# Patient Record
Sex: Female | Born: 1970 | Race: White | Hispanic: No | Marital: Married | Smoking: Never smoker
Health system: Southern US, Community
[De-identification: ages and names within clinical notes are randomized; demographics above are authoritative.]

## PROBLEM LIST (undated history)

## (undated) DIAGNOSIS — K219 Gastro-esophageal reflux disease without esophagitis: Secondary | ICD-10-CM

## (undated) DIAGNOSIS — I10 Essential (primary) hypertension: Secondary | ICD-10-CM

## (undated) DIAGNOSIS — R7401 Elevation of levels of liver transaminase levels: Secondary | ICD-10-CM

## (undated) DIAGNOSIS — E119 Type 2 diabetes mellitus without complications: Secondary | ICD-10-CM

## (undated) DIAGNOSIS — R740 Nonspecific elevation of levels of transaminase and lactic acid dehydrogenase [LDH]: Secondary | ICD-10-CM

## (undated) DIAGNOSIS — E039 Hypothyroidism, unspecified: Secondary | ICD-10-CM

## (undated) DIAGNOSIS — G43909 Migraine, unspecified, not intractable, without status migrainosus: Secondary | ICD-10-CM

## (undated) DIAGNOSIS — J45909 Unspecified asthma, uncomplicated: Secondary | ICD-10-CM

## (undated) DIAGNOSIS — M797 Fibromyalgia: Secondary | ICD-10-CM

## (undated) DIAGNOSIS — E7800 Pure hypercholesterolemia, unspecified: Secondary | ICD-10-CM

## (undated) DIAGNOSIS — F329 Major depressive disorder, single episode, unspecified: Secondary | ICD-10-CM

## (undated) DIAGNOSIS — F32A Depression, unspecified: Secondary | ICD-10-CM

## (undated) DIAGNOSIS — E282 Polycystic ovarian syndrome: Secondary | ICD-10-CM

## (undated) HISTORY — DX: Elevation of levels of liver transaminase levels: R74.01

## (undated) HISTORY — DX: Unspecified asthma, uncomplicated: J45.909

## (undated) HISTORY — DX: Essential (primary) hypertension: I10

## (undated) HISTORY — PX: CHOLECYSTECTOMY: SHX55

## (undated) HISTORY — DX: Hypothyroidism, unspecified: E03.9

## (undated) HISTORY — PX: OTHER SURGICAL HISTORY: SHX169

## (undated) HISTORY — DX: Nonspecific elevation of levels of transaminase and lactic acid dehydrogenase (ldh): R74.0

## (undated) HISTORY — DX: Gastro-esophageal reflux disease without esophagitis: K21.9

---

## 1997-12-11 ENCOUNTER — Encounter: Admission: RE | Admit: 1997-12-11 | Discharge: 1997-12-11 | Payer: Self-pay | Admitting: Internal Medicine

## 1997-12-29 ENCOUNTER — Encounter: Admission: RE | Admit: 1997-12-29 | Discharge: 1997-12-29 | Payer: Self-pay | Admitting: Hematology and Oncology

## 1998-02-18 ENCOUNTER — Encounter: Admission: RE | Admit: 1998-02-18 | Discharge: 1998-02-18 | Payer: Self-pay | Admitting: Internal Medicine

## 1998-02-18 ENCOUNTER — Other Ambulatory Visit: Admission: RE | Admit: 1998-02-18 | Discharge: 1998-02-18 | Payer: Self-pay | Admitting: Obstetrics

## 1998-03-03 ENCOUNTER — Encounter: Admission: RE | Admit: 1998-03-03 | Discharge: 1998-03-03 | Payer: Self-pay | Admitting: Internal Medicine

## 1998-04-09 ENCOUNTER — Encounter: Admission: RE | Admit: 1998-04-09 | Discharge: 1998-04-09 | Payer: Self-pay | Admitting: Internal Medicine

## 1998-05-19 ENCOUNTER — Encounter: Admission: RE | Admit: 1998-05-19 | Discharge: 1998-05-19 | Payer: Self-pay | Admitting: Internal Medicine

## 1998-06-23 ENCOUNTER — Encounter: Admission: RE | Admit: 1998-06-23 | Discharge: 1998-06-23 | Payer: Self-pay | Admitting: Internal Medicine

## 1998-06-25 ENCOUNTER — Ambulatory Visit (HOSPITAL_COMMUNITY): Admission: RE | Admit: 1998-06-25 | Discharge: 1998-06-25 | Payer: Self-pay | Admitting: Internal Medicine

## 1998-06-25 ENCOUNTER — Encounter: Payer: Self-pay | Admitting: Internal Medicine

## 1998-08-17 ENCOUNTER — Encounter: Admission: RE | Admit: 1998-08-17 | Discharge: 1998-08-17 | Payer: Self-pay | Admitting: Obstetrics & Gynecology

## 1998-10-25 ENCOUNTER — Encounter: Admission: RE | Admit: 1998-10-25 | Discharge: 1998-10-25 | Payer: Self-pay | Admitting: Internal Medicine

## 1998-11-17 ENCOUNTER — Encounter: Admission: RE | Admit: 1998-11-17 | Discharge: 1998-11-17 | Payer: Self-pay | Admitting: Internal Medicine

## 1998-11-17 ENCOUNTER — Encounter: Payer: Self-pay | Admitting: Internal Medicine

## 1998-11-17 ENCOUNTER — Ambulatory Visit (HOSPITAL_COMMUNITY): Admission: RE | Admit: 1998-11-17 | Discharge: 1998-11-17 | Payer: Self-pay | Admitting: Internal Medicine

## 1998-11-29 ENCOUNTER — Encounter: Admission: RE | Admit: 1998-11-29 | Discharge: 1998-11-29 | Payer: Self-pay | Admitting: Internal Medicine

## 1998-12-28 ENCOUNTER — Encounter: Admission: RE | Admit: 1998-12-28 | Discharge: 1998-12-28 | Payer: Self-pay | Admitting: Internal Medicine

## 1999-04-15 ENCOUNTER — Ambulatory Visit (HOSPITAL_COMMUNITY): Admission: RE | Admit: 1999-04-15 | Discharge: 1999-04-15 | Payer: Self-pay | Admitting: Urology

## 1999-04-15 ENCOUNTER — Encounter: Payer: Self-pay | Admitting: Urology

## 1999-09-19 ENCOUNTER — Other Ambulatory Visit: Admission: RE | Admit: 1999-09-19 | Discharge: 1999-09-19 | Payer: Self-pay | Admitting: *Deleted

## 2010-07-10 ENCOUNTER — Emergency Department (HOSPITAL_COMMUNITY): Admission: EM | Admit: 2010-07-10 | Discharge: 2010-07-11 | Payer: Self-pay | Admitting: Emergency Medicine

## 2010-07-11 IMAGING — CR DG ABDOMEN ACUTE W/ 1V CHEST
4 series · 4 of 4 positions shown · non-contrast
Comparison: None.

CLINICAL DATA: Abdominal pain, nausea, vomiting and diarrhea.

ACUTE ABDOMEN SERIES (ABDOMEN 2 VIEW & CHEST 1 VIEW)

[view not recorded (1 of 4)]
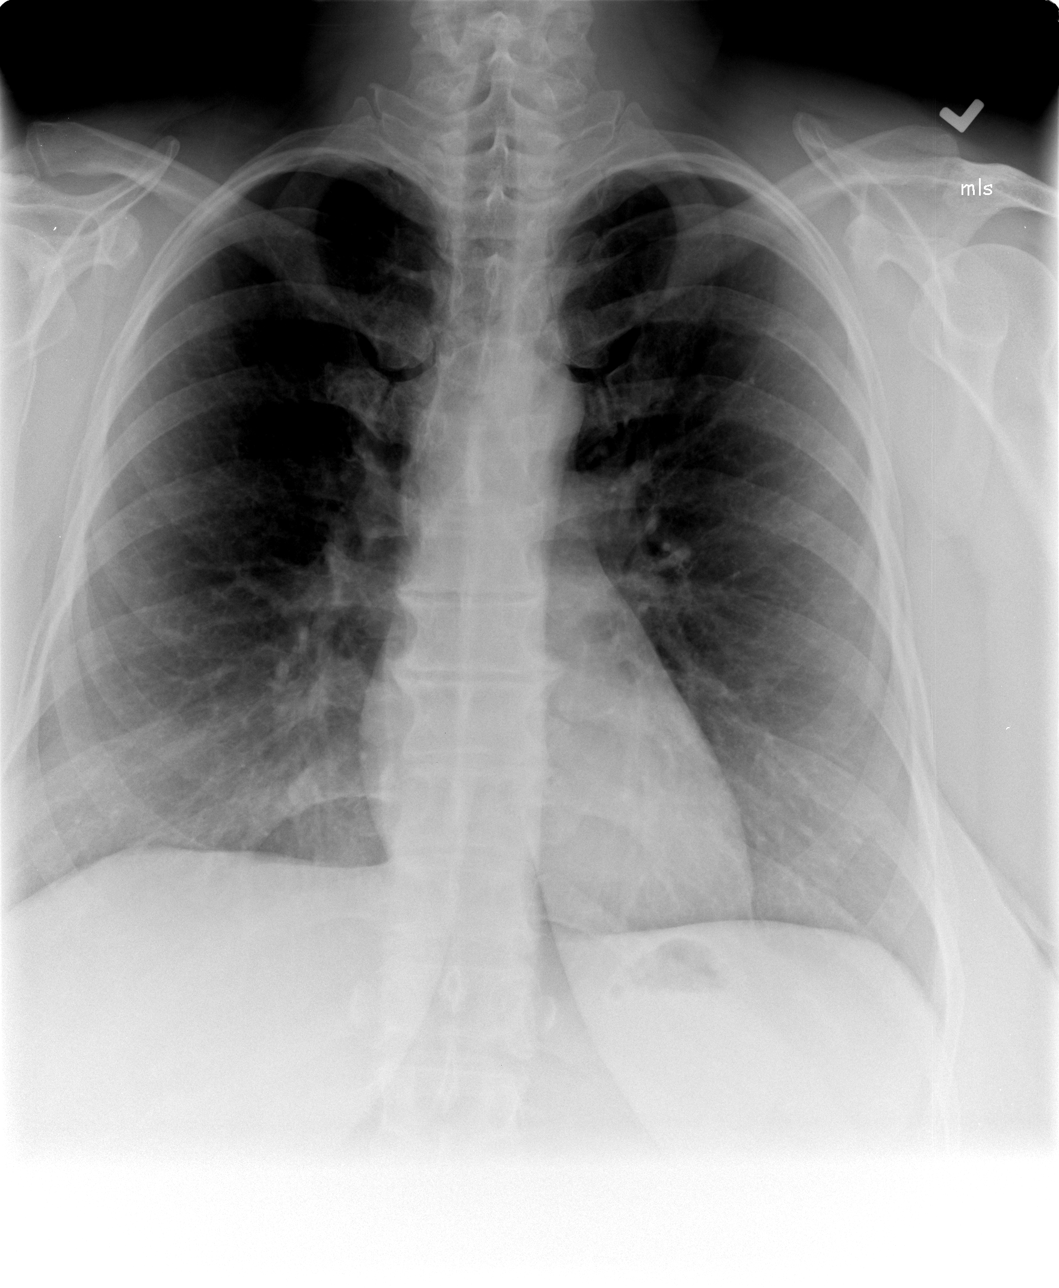

[view not recorded (2 of 4)]
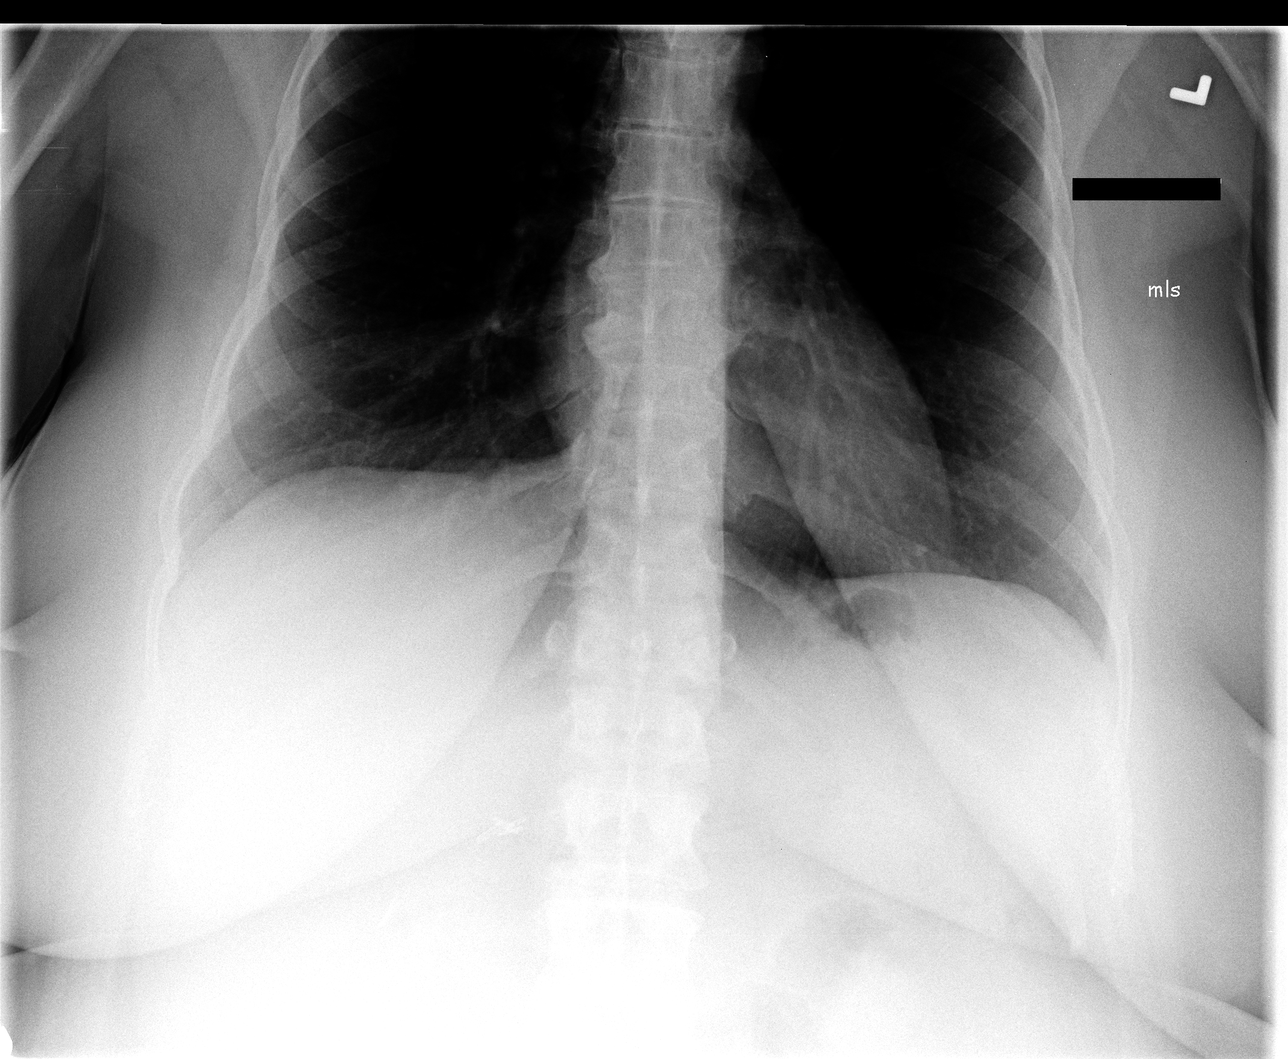

[view not recorded (3 of 4)]
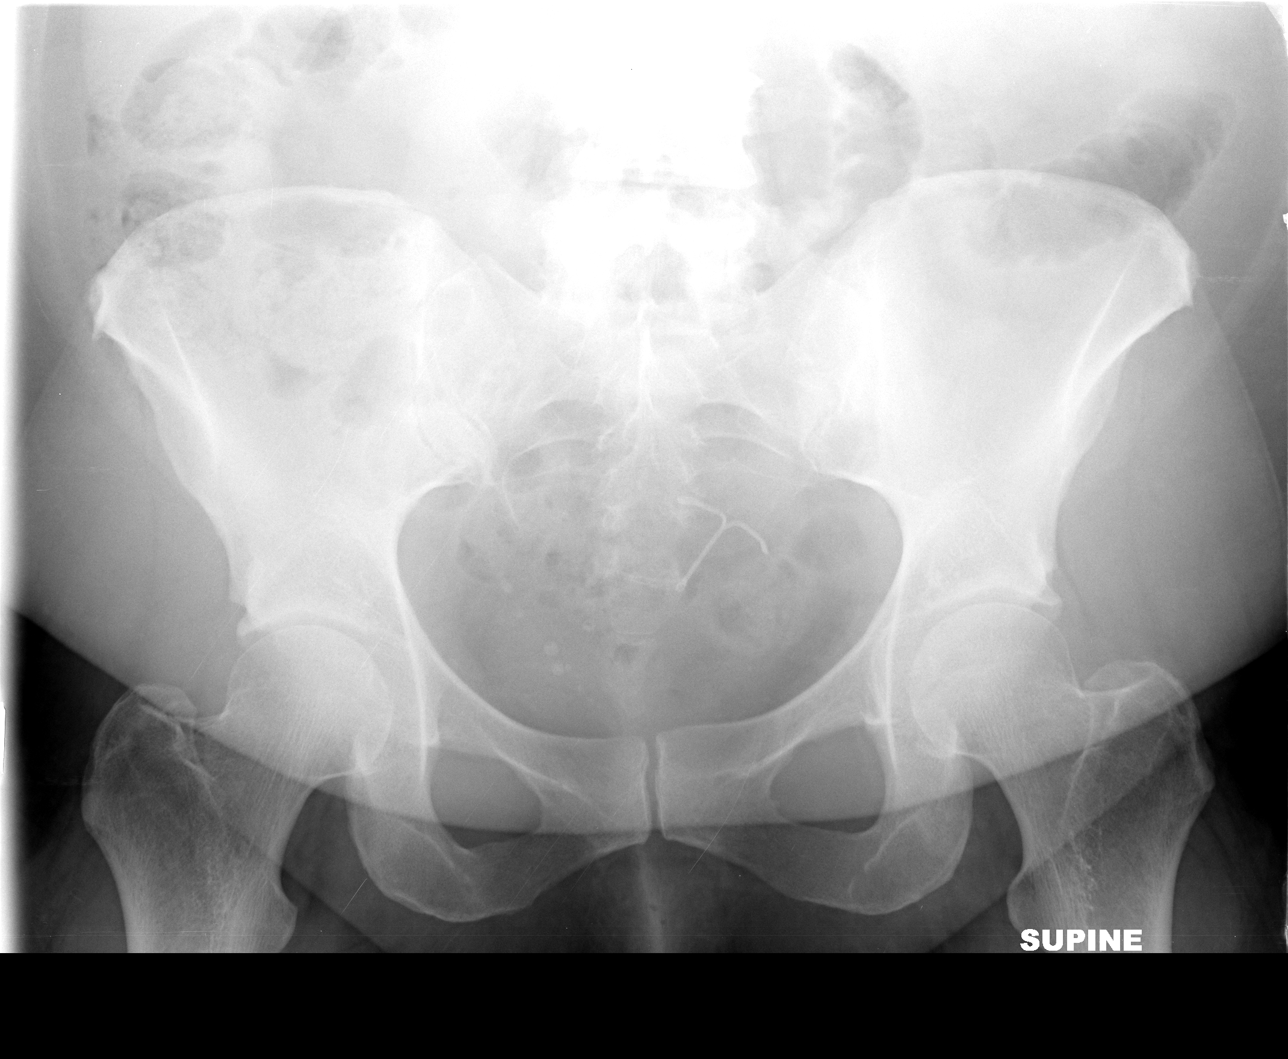

[view not recorded (4 of 4)]
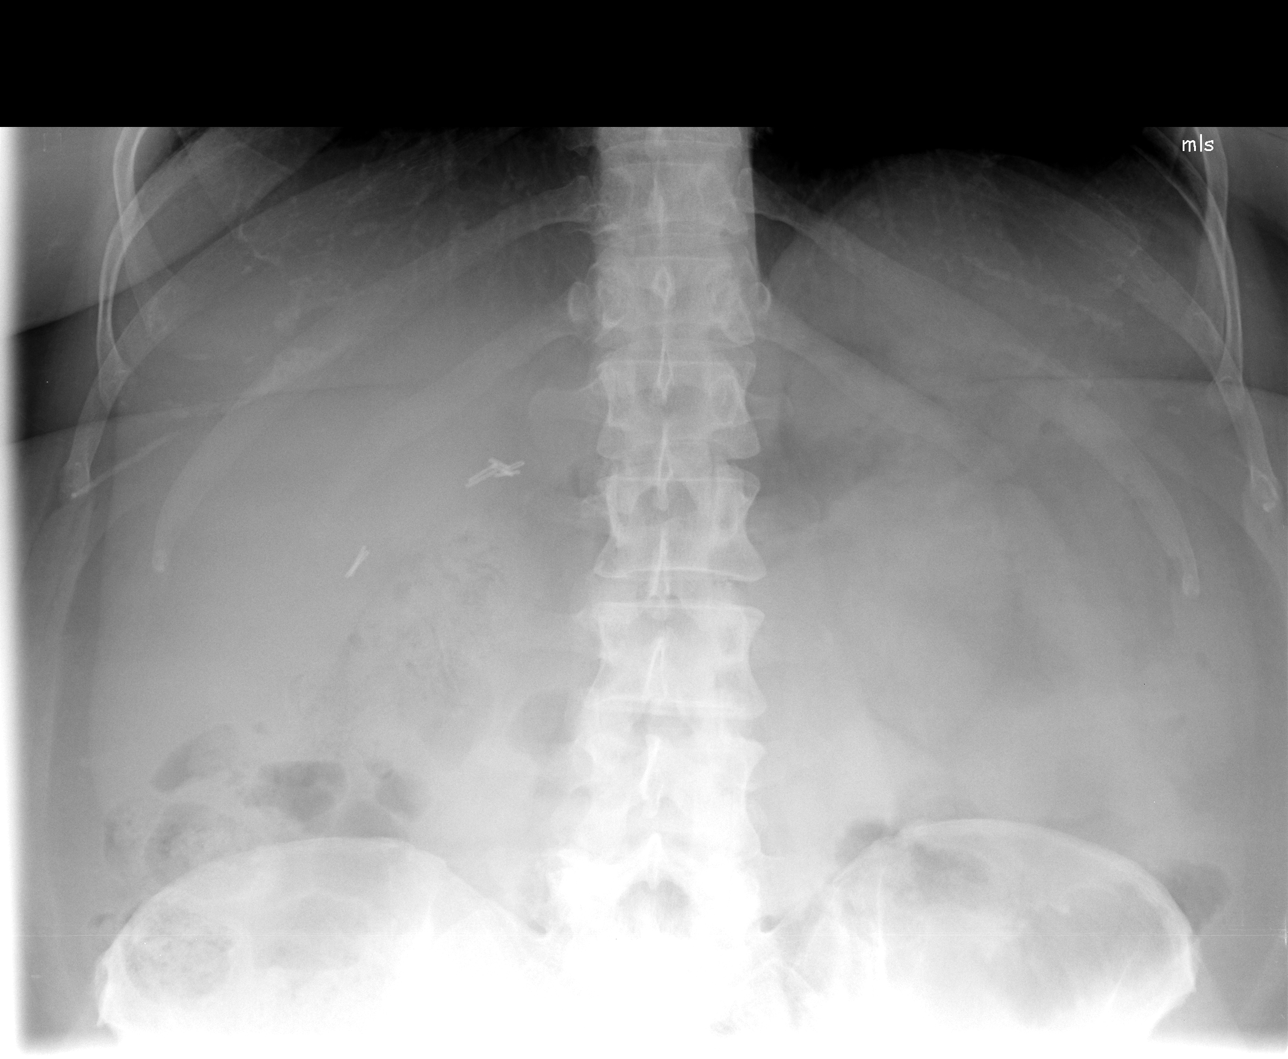

[4 of 4 positions shown; findings below may reference images not displayed]

FINDINGS: The lungs are well-aerated and clear.  There is no
evidence of focal opacification, pleural effusion or pneumothorax.
The cardiomediastinal silhouette is within normal limits.

The visualized bowel gas pattern is unremarkable. Stool and air are
noted throughout the colon; there is no evidence of small bowel
dilatation to suggest obstruction.  No free intra-abdominal air is
identified on the provided upright view.  An intrauterine device is
imaged within the pelvis.  Clips are noted within the right upper
quadrant, reflecting prior cholecystectomy.

No acute osseous abnormalities are seen; the sacroiliac joints are
unremarkable in appearance.
IMPRESSION: 1.  Unremarkable bowel gas pattern; no free intra-abdominal air
seen.
2.  No acute cardiopulmonary process identified.

## 2010-09-11 ENCOUNTER — Encounter: Payer: Self-pay | Admitting: Internal Medicine

## 2010-11-01 LAB — CBC
Hemoglobin: 16 g/dL — ABNORMAL HIGH (ref 12.0–15.0)
MCH: 30.6 pg (ref 26.0–34.0)
MCHC: 33.6 g/dL (ref 30.0–36.0)
MCV: 91.2 fL (ref 78.0–100.0)
Platelets: 237 10*3/uL (ref 150–400)

## 2010-11-01 LAB — URINALYSIS, ROUTINE W REFLEX MICROSCOPIC
Glucose, UA: NEGATIVE mg/dL
Ketones, ur: >80 mg/dL — AB
Leukocytes, UA: NEGATIVE
Nitrite: NEGATIVE
Protein, ur: 30 mg/dL — AB
Specific Gravity, Urine: 1.025 (ref 1.005–1.030)
Urobilinogen, UA: 0.2 mg/dL (ref 0.0–1.0)
pH: 6 (ref 5.0–8.0)

## 2010-11-01 LAB — URINE MICROSCOPIC-ADD ON

## 2010-11-01 LAB — DIFFERENTIAL
Basophils Relative: 1 % (ref 0–1)
Eosinophils Absolute: 0 10*3/uL (ref 0.0–0.7)
Eosinophils Relative: 0 % (ref 0–5)
Lymphs Abs: 1.6 10*3/uL (ref 0.7–4.0)
Monocytes Relative: 4 % (ref 3–12)
Neutrophils Relative %: 86 % — ABNORMAL HIGH (ref 43–77)

## 2010-11-01 LAB — BASIC METABOLIC PANEL
CO2: 24 mEq/L (ref 19–32)
Calcium: 10.2 mg/dL (ref 8.4–10.5)
Chloride: 92 mEq/L — ABNORMAL LOW (ref 96–112)
Creatinine, Ser: 1.22 mg/dL — ABNORMAL HIGH (ref 0.4–1.2)
Glucose, Bld: 121 mg/dL — ABNORMAL HIGH (ref 70–99)

## 2014-11-14 ENCOUNTER — Emergency Department (HOSPITAL_BASED_OUTPATIENT_CLINIC_OR_DEPARTMENT_OTHER)
Admission: EM | Admit: 2014-11-14 | Discharge: 2014-11-14 | Disposition: A | Discharge status: Home / Self Care | Payer: Medicaid Other | Attending: Emergency Medicine | Admitting: Emergency Medicine

## 2014-11-14 ENCOUNTER — Encounter (HOSPITAL_BASED_OUTPATIENT_CLINIC_OR_DEPARTMENT_OTHER): Payer: Self-pay

## 2014-11-14 DIAGNOSIS — E780 Pure hypercholesterolemia: Secondary | ICD-10-CM | POA: Diagnosis not present

## 2014-11-14 DIAGNOSIS — Z3202 Encounter for pregnancy test, result negative: Secondary | ICD-10-CM | POA: Insufficient documentation

## 2014-11-14 DIAGNOSIS — R350 Frequency of micturition: Secondary | ICD-10-CM | POA: Diagnosis present

## 2014-11-14 DIAGNOSIS — E669 Obesity, unspecified: Secondary | ICD-10-CM | POA: Diagnosis not present

## 2014-11-14 DIAGNOSIS — Z79899 Other long term (current) drug therapy: Secondary | ICD-10-CM | POA: Diagnosis not present

## 2014-11-14 DIAGNOSIS — N390 Urinary tract infection, site not specified: Secondary | ICD-10-CM | POA: Diagnosis not present

## 2014-11-14 DIAGNOSIS — E119 Type 2 diabetes mellitus without complications: Secondary | ICD-10-CM | POA: Insufficient documentation

## 2014-11-14 DIAGNOSIS — M797 Fibromyalgia: Secondary | ICD-10-CM | POA: Insufficient documentation

## 2014-11-14 DIAGNOSIS — G43909 Migraine, unspecified, not intractable, without status migrainosus: Secondary | ICD-10-CM | POA: Diagnosis not present

## 2014-11-14 DIAGNOSIS — F329 Major depressive disorder, single episode, unspecified: Secondary | ICD-10-CM | POA: Insufficient documentation

## 2014-11-14 HISTORY — DX: Depression, unspecified: F32.A

## 2014-11-14 HISTORY — DX: Major depressive disorder, single episode, unspecified: F32.9

## 2014-11-14 HISTORY — DX: Type 2 diabetes mellitus without complications: E11.9

## 2014-11-14 HISTORY — DX: Pure hypercholesterolemia, unspecified: E78.00

## 2014-11-14 HISTORY — DX: Migraine, unspecified, not intractable, without status migrainosus: G43.909

## 2014-11-14 HISTORY — DX: Polycystic ovarian syndrome: E28.2

## 2014-11-14 HISTORY — DX: Fibromyalgia: M79.7

## 2014-11-14 LAB — URINALYSIS, ROUTINE W REFLEX MICROSCOPIC
BILIRUBIN URINE: NEGATIVE
Ketones, ur: NEGATIVE mg/dL
Nitrite: NEGATIVE
PH: 6 (ref 5.0–8.0)
PROTEIN: NEGATIVE mg/dL
Specific Gravity, Urine: 1.021 (ref 1.005–1.030)
UROBILINOGEN UA: 0.2 mg/dL (ref 0.0–1.0)

## 2014-11-14 LAB — CBG MONITORING, ED: GLUCOSE-CAPILLARY: 98 mg/dL (ref 70–99)

## 2014-11-14 LAB — URINE MICROSCOPIC-ADD ON

## 2014-11-14 LAB — PREGNANCY, URINE: PREG TEST UR: NEGATIVE

## 2014-11-14 MED ORDER — PHENAZOPYRIDINE HCL 200 MG PO TABS: 200.0000 mg | ORAL_TABLET | Freq: Three times a day (TID) | ORAL | Status: AC

## 2014-11-14 MED ORDER — PHENAZOPYRIDINE HCL 100 MG PO TABS
200.0000 mg | ORAL_TABLET | Freq: Once | ORAL | Status: AC
  Administered 2014-11-14: 200 mg via ORAL
  Filled 2014-11-14: qty 2

## 2014-11-14 MED ORDER — CEPHALEXIN 250 MG PO CAPS
500.0000 mg | ORAL_CAPSULE | Freq: Once | ORAL | Status: AC
  Administered 2014-11-14: 500 mg via ORAL
  Filled 2014-11-14: qty 2

## 2014-11-14 MED ORDER — CEPHALEXIN 500 MG PO CAPS: 500.0000 mg | ORAL_CAPSULE | Freq: Two times a day (BID) | ORAL | Status: AC

## 2014-11-14 NOTE — Discharge Instructions (Signed)
°  Take your antibiotics as directed and to completion. You should never have any leftover antibiotics! Push fluids and stay well hydrated.  ° °Any antibiotic use can reduce the efficacy of hormonal birth control. Please use back up method of contraception.  ° °Please follow with your primary care doctor in the next 2 days for a check-up. They must obtain records for further management.  ° °Do not hesitate to return to the Emergency Department for any new, worsening or concerning symptoms.  ° °

## 2014-11-14 NOTE — ED Provider Notes (Signed)
CSN: [id number]     Arrival date & time 11/14/14  1522 History   First MD Initiated Contact with Patient 11/14/14 1555     Chief Complaint  Patient presents with  . Urinary Frequency     (Consider location/radiation/quality/duration/timing/severity/associated sxs/prior Treatment) HPI  Karen Barrett is a 44 y.o. female complaining of urinary frequency, she denies dysuria but states she has a "strange sensation" when she urinates, she cannot further characterize this, sensation of incomplete voiding and concentrated urine worsening over the course of one week. She denies fever, chills, nausea, vomiting, flank pain, abdominal pain, abnormal vaginal discharge   Past Medical History  Diagnosis Date  . Diabetes mellitus without complication   . PCOS (polycystic ovarian syndrome)   . Fibromyalgia   . Depression   . Hypercholesteremia   . Migraine    Past Surgical History  Procedure Laterality Date  . Cholecystectomy    . Toenail surgery     No family history on file. History  Substance Use Topics  . Smoking status: Never Smoker   . Smokeless tobacco: Not on file  . Alcohol Use: No   OB History    No data available     Review of Systems  10 systems reviewed and found to be negative, except as noted in the HPI.   Allergies  Codeine; Erythromycin; Paxil; Zoloft; and Sulfa antibiotics  Home Medications   Prior to Admission medications   Medication Sig Start Date End Date Taking? Authorizing Provider  buPROPion (WELLBUTRIN) 100 MG tablet Take 100 mg by mouth 2 (two) times daily.   Yes Historical Provider, MD  Canagliflozin (INVOKANA PO) Take by mouth.   Yes Historical Provider, MD  cyclobenzaprine (FLEXERIL) 10 MG tablet Take 10 mg by mouth 3 (three) times daily as needed for muscle spasms.   Yes Historical Provider, MD  rosuvastatin (CRESTOR) 10 MG tablet Take 10 mg by mouth daily.   Yes Historical Provider, MD  SUMAtriptan (IMITREX) 20 MG/ACT nasal spray Place 20  mg into the nose every 2 (two) hours as needed for migraine or headache. May repeat in 2 hours if headache persists or recurs.   Yes Historical Provider, MD  topiramate (TOPAMAX) 100 MG tablet Take 100 mg by mouth 2 (two) times daily.   Yes Historical Provider, MD  traMADol (ULTRAM) 50 MG tablet Take by mouth every 6 (six) hours as needed.   Yes Historical Provider, MD  cephALEXin (KEFLEX) 500 MG capsule Take 1 capsule (500 mg total) by mouth 2 (two) times daily. 11/14/14   Kaily Wragg, PA-C  phenazopyridine (PYRIDIUM) 200 MG tablet Take 1 tablet (200 mg total) by mouth 3 (three) times daily. 11/14/14   Halona Amstutz, PA-C   BP 153/93 mmHg  Pulse 93  Temp(Src) 98 F (36.7 C) (Oral)  Resp 16  Ht  (1.651 m)  Wt 290 lb (131.543 kg)  BMI 48.26 kg/m2  SpO2 100% Physical Exam  Constitutional: She is oriented to person, place, and time. She appears well-developed and well-nourished. No distress.  Obese  HENT:  Head: Normocephalic and atraumatic.  Mouth/Throat: Oropharynx is clear and moist.  Eyes: Conjunctivae and EOM are normal. Pupils are equal, round, and reactive to light.  Neck: Normal range of motion.  Cardiovascular: Normal rate, regular rhythm and intact distal pulses.   Pulmonary/Chest: Effort normal and breath sounds normal. No stridor. No respiratory distress. She has no wheezes. She has no rales. She exhibits no tenderness.  Abdominal: Soft. She exhibits no distension  and no mass. There is no tenderness. There is no rebound and no guarding.  Genitourinary:  No CVA tenderness to palpation bilaterally  Musculoskeletal: Normal range of motion.  Neurological: She is alert and oriented to person, place, and time.  Psychiatric: She has a normal mood and affect.  Nursing note and vitals reviewed.   ED Course  Procedures (including critical care time) Labs Review Labs Reviewed  URINALYSIS, ROUTINE W REFLEX MICROSCOPIC - Abnormal; Notable for the following:    APPearance  CLOUDY (*)    Glucose, UA >1000 (*)    Hgb urine dipstick LARGE (*)    Leukocytes, UA LARGE (*)    All other components within normal limits  URINE MICROSCOPIC-ADD ON - Abnormal; Notable for the following:    Bacteria, UA MANY (*)    All other components within normal limits  URINE CULTURE  PREGNANCY, URINE  CBG MONITORING, ED    Imaging Review No results found.   EKG Interpretation None      MDM   Final diagnoses:  UTI (lower urinary tract infection)    Filed Vitals:   11/14/14 1527 11/14/14 1635  BP: 147/90 153/93  Pulse: 89 93  Temp: 98 F (36.7 C)   TempSrc: Oral   Resp: 18 16  Height: 5\' 5"  (1.651 m)   Weight: 290 lb (131.543 kg)   SpO2: 100% 100%    Medications  cephALEXin (KEFLEX) capsule 500 mg (500 mg Oral Given 11/14/14 1641)  phenazopyridine (PYRIDIUM) tablet 200 mg (200 mg Oral Given 11/14/14 1641)    [name] is a pleasant 44 y.o. female presenting with uncomplicated urinary tract infection, urine culture is ordered. Patient is afebrile, well-appearing and tolerating by mouth. I will start her on Keflex.   Evaluation does not show pathology that would require ongoing emergent intervention or inpatient treatment. Pt is hemodynamically stable and mentating appropriately. Discussed findings and plan with patient/guardian, who agrees with care plan. All questions answered. Return precautions discussed and outpatient follow up given.   Discharge Medication List as of 11/14/2014  4:36 PM    START taking these medications   Details  cephALEXin (KEFLEX) 500 MG capsule Take 1 capsule (500 mg total) by mouth 2 (two) times daily., Starting 11/14/2014, Until Discontinued, Print    phenazopyridine (PYRIDIUM) 200 MG tablet Take 1 tablet (200 mg total) by mouth 3 (three) times daily., Starting 11/14/2014, Until Discontinued, Print             Wynetta Emeryicole Niamya Vittitow, PA-C 11/14/14 1737  Tilden FossaElizabeth Rees, MD 11/14/14 [phone]

## 2014-11-14 NOTE — ED Notes (Signed)
Pt reports urinary frequency for about 1 week, denies dysuria.  Reports "it feels weird when i get done peeing".  Denies additional symptoms.

## 2014-11-17 LAB — URINE CULTURE: Colony Count: 30000

## 2014-11-18 ENCOUNTER — Telehealth (HOSPITAL_BASED_OUTPATIENT_CLINIC_OR_DEPARTMENT_OTHER): Payer: Self-pay | Admitting: Emergency Medicine

## 2014-11-18 NOTE — Telephone Encounter (Signed)
Post ED Visit - Positive Culture Follow-up  Culture report reviewed by antimicrobial stewardship pharmacist: []  Wes Dulaney, Pharm.D., BCPS []  Celedonio MiyamotoJeremy Frens, [address] BCPS []  Georgina PillionElizabeth Martin, [address] BCPS []  PrincetonMinh Pham, [address] BCPS, AAHIVP [x]  Estella HuskMichelle Turner, Pharm.D., BCPS, AAHIVP []  Elder CyphersLorie Poole, [address] BCPS  Positive urine culture E. Coli Treated with cephalexin , organism sensitive to the same and no further patient follow-up is required at this time.  Berle MullMiller, Cierra Rothgeb 11/18/2014, 12:32 PM

## 2015-09-14 ENCOUNTER — Ambulatory Visit: Payer: Self-pay | Admitting: Pediatrics

## 2015-10-04 ENCOUNTER — Ambulatory Visit (INDEPENDENT_AMBULATORY_CARE_PROVIDER_SITE_OTHER): Payer: Medicaid Other | Admitting: Internal Medicine

## 2015-10-04 ENCOUNTER — Encounter: Payer: Self-pay | Admitting: Internal Medicine

## 2015-10-04 VITALS — BP 124/80 | HR 98 | Temp 98.8°F | Resp 20 | Ht 65.55 in | Wt 307.8 lb

## 2015-10-04 DIAGNOSIS — J3089 Other allergic rhinitis: Secondary | ICD-10-CM | POA: Diagnosis not present

## 2015-10-04 DIAGNOSIS — J45909 Unspecified asthma, uncomplicated: Secondary | ICD-10-CM | POA: Insufficient documentation

## 2015-10-04 DIAGNOSIS — J4520 Mild intermittent asthma, uncomplicated: Secondary | ICD-10-CM | POA: Diagnosis not present

## 2015-10-04 MED ORDER — MOMETASONE FUROATE 50 MCG/ACT NA SUSP: 2.0000 | Freq: Every day | NASAL | Status: DC

## 2015-10-04 NOTE — Progress Notes (Signed)
Referring provider: Jackie Plum, MD [address] [place], [place] [address]  History of Present Illness:  Karen Barrett is a 45 y.o. female  seen in consultation at the kind request Dr. Leanor Kail for rhinitis and asthma  HPI Comments: Rhinitis/migraines: Patient has had symptoms for many years. She has symptoms a few times a week and she feels that is worse as she has gotten older. She has tried [employer] but does not like using it due to burning of her nostrils. She has never had skin testing before. She does not get repeated infections requiring antibiotics. She saw an ENT for enlarged tonsils and surgery was recommended reportedly but she is deferring  Asthma: Patient was diagnosed several years ago. She has never been to the emergency room or hospitalized. She gets bronchitis a few times a year. Main trigger is exercise. She uses albuterol infrequently.   Assessment and Plan: Asthma  Intermittent, currently well controlled  Continue as needed albuterol (pro-air)   Other allergic rhinitis  Allergic to cat, mold. Handouts given  Start Nasonex 2 sprays each nostril daily  Start nasal saline lavage daily prior to nasal spray  Avoid using oxymetazoline nose sprays due to rebound nasal congestion  Consider allergy injections if you will be getting another cat    Return in about 3 months (around 01/01/2016).  Medications ordered this encounter: Meds ordered this encounter  Medications  . DISCONTD: levothyroxine (SYNTHROID, LEVOTHROID) 75 MCG tablet    Sig: Take by mouth.  . meloxicam (MOBIC) 15 MG tablet    Sig: Take 15 mg by mouth.  . ondansetron (ZOFRAN) 4 MG tablet    Sig: Take 4 mg by mouth as needed.   Marland Kitchen DISCONTD: spironolactone (ALDACTONE) 50 MG tablet    Sig: Take 50 mg by mouth.  . SUMAtriptan (IMITREX) 25 MG tablet    Sig: Take 25 mg by mouth.  Marland Kitchen acyclovir (ZOVIRAX) 400 MG tablet    Sig: as needed.     Refill:  2  . PROAIR HFA 108 (90 Base)  MCG/ACT inhaler    Sig: INHALE 2 PUFFS PO QID    Refill:  1  . busPIRone (BUSPAR) 5 MG tablet    Sig: TK 1 T PO BID    Refill:  0  . Butalbital-APAP-Caffeine 50-300-40 MG CAPS    Sig: TK ONE C PO  Q 6 H PRN for migraine headache    Refill:  0  . clobetasol cream (TEMOVATE) 0.05 %    Sig: APPLY PRN    Refill:  0  . dicyclomine (BENTYL) 20 MG tablet    Sig: TK 1 T PO PRN    Refill:  2  . BYETTA 5 MCG PEN 5 MCG/0.02ML SOPN injection    Sig: INJECT 5 MCG UNDER THE SKIN BID    Refill:  3  . B-D UF III MINI PEN NEEDLES 31G X 5 MM MISC    Sig: USE UTD    Refill:  2  . Insulin Syringe-Needle U-100 (INSULIN SYRINGE 1CC/30GX5/16") 30G X 5/16" 1 ML MISC    Sig: U UTD    Refill:  2  . levothyroxine (SYNTHROID, LEVOTHROID) 88 MCG tablet    Sig: TK 1 T PO QD    Refill:  3  . phentermine 37.5 MG capsule    Sig: TAKE 1 CAPLET PRN    Refill:  2  . omeprazole (PRILOSEC) 40 MG capsule    Sig: TK ONE C PO  D  Refill:  2  . metroNIDAZOLE (METROGEL) 0.75 % vaginal gel    Sig: USE 1 APPLICATORFUL VAGINALLY PRN    Refill:  0  . pravastatin (PRAVACHOL) 40 MG tablet    Sig: TK 1 T ONCE A DAY HS    Refill:  2  . DISCONTD: spironolactone (ALDACTONE) 100 MG tablet    Sig: TK 1 T PO BID    Refill:  3  . spironolactone (ALDACTONE) 100 MG tablet    Sig: Take 100 mg by mouth 2 (two) times daily.  . promethazine (PHENERGAN) 25 MG suppository    Sig: Place 25 mg rectally as needed for nausea or vomiting.  . Vitamin D, Cholecalciferol, 1000 units CAPS    Sig: Take 1,000 mg by mouth. TAKE S 2 CAPS TWICE DAILY  . FIBER SELECT GUMMIES CHEW    Sig: Chew by mouth. 2 GUMMIES DAILY  . Probiotic Product ([employer] ADV PROBIOTIC GUMMIES PO)    Sig: Take by mouth. 2 BY MOUTH DAILY    Diagnostics: Spirometry: FEV1 2.85L or 90%, FEV1/FVC  81%.  This is a normal study.  No significant bronchodilator response Aeroallergen skin testing: Positive for mold and cat with a good histamine control  Food allergy skin  testing: Negative with a good histamine control  Skin tests were interpreted by me, transferred into EPIC by CMA, reviewed and accepted by me into EPIC.  Physical Exam: BP 124/80 mmHg  Pulse 98  Temp(Src) 98.8 F (37.1 C) (Oral)  Resp 20  Ht 5' 5.55" (1.665 m)  Wt 307 lb 12.2 oz (139.6 kg)  BMI 50.36 kg/m2   Physical Exam  Constitutional: She appears well-developed and well-nourished. No distress.  Obese  HENT:  Right Ear: External ear normal.  Left Ear: External ear normal.  Nose: Nose normal.  Mouth/Throat: Oropharynx is clear and moist.  Eyes: Conjunctivae are normal. Right eye exhibits no discharge. Left eye exhibits no discharge.  Cardiovascular: Normal rate, regular rhythm and normal heart sounds.   No murmur heard. Pulmonary/Chest: Effort normal and breath sounds normal. No respiratory distress. She has no wheezes. She has no rales.  Abdominal: Soft. Bowel sounds are normal.  Musculoskeletal: She exhibits no edema.  Lymphadenopathy:    She has no cervical adenopathy.  Neurological: She is alert.  Skin: No rash noted.  Vitals reviewed.   Review of systems: Per HPI unless specifically indicated below Review of Systems  Constitutional: Negative for fever, chills, appetite change and unexpected weight change.  HENT: Positive for postnasal drip, rhinorrhea, sinus pressure and sneezing. Negative for congestion, ear pain and sore throat.   Eyes: Negative for pain and itching.  Respiratory: Positive for shortness of breath and wheezing. Negative for cough and chest tightness.   Cardiovascular: Negative for chest pain and leg swelling.  Gastrointestinal: Negative for vomiting and diarrhea.  Genitourinary: Negative for difficulty urinating.  Musculoskeletal: Negative for joint swelling and arthralgias.  Skin: Negative for rash.  Allergic/Immunologic: Positive for environmental allergies. Negative for food allergies and immunocompromised state.       Stung by unidentified  insect - no problem No latex allergy  Neurological: Negative for seizures.    Past medical history:  Patient Active Problem List   Diagnosis Date Noted  . Asthma 10/04/2015  . Other allergic rhinitis 10/04/2015    Past surgical history: Past Surgical History  Procedure Laterality Date  . Cholecystectomy    . Toenail surgery      Family history: Family History  Problem Relation  Age of Onset  . Allergic rhinitis Mother   . Asthma Mother   . Cancer Father   . Food Allergy Sister   . Emphysema Maternal Grandmother   . Congestive Heart Failure Maternal Grandmother   . Eczema Son   . Anxiety disorder Son   . Autism Son   . Congestive Heart Failure Maternal Grandfather     Environmental/Social history: she lives in a house that is 44 years of age, she has a nonfeather pillow and comforter, there is carpet in the bedroom, there is central a/c and heating, she had a cat until today, there is a finished basement in the home, she is a nonsmoker, she is a homemaker  Drug Allergies:  Allergies  Allergen Reactions  . Codeine Nausea And Vomiting  . Erythromycin Other (See Comments)    unknown  . Paxil [Paroxetine Hcl] Other (See Comments)    Worsening depression  . Sertraline Other (See Comments)  . Zoloft [Sertraline Hcl] Other (See Comments)    Worsening depression  . Sulfa Antibiotics Rash    Medications: Current outpatient prescriptions:  .  acyclovir (ZOVIRAX) 400 MG tablet, as needed. , Disp: , Rfl: 2 .  B-D UF III MINI PEN NEEDLES 31G X 5 MM MISC, USE UTD, Disp: , Rfl: 2 .  buPROPion (WELLBUTRIN) 100 MG tablet, Take 100 mg by mouth 2 (two) times daily. Pt taking 2 tablets twice daily, Disp: , Rfl:  .  busPIRone (BUSPAR) 5 MG tablet, TK 1 T PO BID, Disp: , Rfl: 0 .  Butalbital-APAP-Caffeine 50-300-40 MG CAPS, TK ONE C PO  Q 6 H PRN for migraine headache, Disp: , Rfl: 0 .  BYETTA 5 MCG PEN 5 MCG/0.02ML SOPN injection, INJECT 5 MCG UNDER THE SKIN BID, Disp: , Rfl: 3 .   clobetasol cream (TEMOVATE) 0.05 %, APPLY PRN, Disp: , Rfl: 0 .  cyclobenzaprine (FLEXERIL) 10 MG tablet, Take 10 mg by mouth 3 (three) times daily as needed for muscle spasms., Disp: , Rfl:  .  dicyclomine (BENTYL) 20 MG tablet, TK 1 T PO PRN, Disp: , Rfl: 2 .  FIBER SELECT GUMMIES CHEW, Chew by mouth. 2 GUMMIES DAILY, Disp: , Rfl:  .  Insulin Syringe-Needle U-100 (INSULIN SYRINGE 1CC/30GX5/16") 30G X 5/16" 1 ML MISC, U UTD, Disp: , Rfl: 2 .  levothyroxine (SYNTHROID, LEVOTHROID) 88 MCG tablet, TK 1 T PO QD, Disp: , Rfl: 3 .  meloxicam (MOBIC) 15 MG tablet, Take 15 mg by mouth., Disp: , Rfl:  .  metroNIDAZOLE (METROGEL) 0.75 % vaginal gel, USE 1 APPLICATORFUL VAGINALLY PRN, Disp: , Rfl: 0 .  omeprazole (PRILOSEC) 40 MG capsule, TK ONE C PO  D, Disp: , Rfl: 2 .  ondansetron (ZOFRAN) 4 MG tablet, Take 4 mg by mouth as needed. , Disp: , Rfl:  .  phentermine 37.5 MG capsule, TAKE 1 CAPLET PRN, Disp: , Rfl: 2 .  pravastatin (PRAVACHOL) 40 MG tablet, TK 1 T ONCE A DAY HS, Disp: , Rfl: 2 .  PROAIR HFA 108 (90 Base) MCG/ACT inhaler, INHALE 2 PUFFS PO QID, Disp: , Rfl: 1 .  Probiotic Product ([employer] ADV PROBIOTIC GUMMIES PO), Take by mouth. 2 BY MOUTH DAILY, Disp: , Rfl:  .  promethazine (PHENERGAN) 25 MG suppository, Place 25 mg rectally as needed for nausea or vomiting., Disp: , Rfl:  .  spironolactone (ALDACTONE) 100 MG tablet, Take 100 mg by mouth 2 (two) times daily., Disp: , Rfl:  .  SUMAtriptan (IMITREX) 25  MG tablet, Take 25 mg by mouth., Disp: , Rfl:  .  topiramate (TOPAMAX) 100 MG tablet, Take 100 mg by mouth 2 (two) times daily., Disp: , Rfl:  .  traMADol (ULTRAM) 50 MG tablet, Take by mouth every 6 (six) hours as needed., Disp: , Rfl:  .  Vitamin D, Cholecalciferol, 1000 units CAPS, Take 1,000 mg by mouth. TAKE S 2 CAPS TWICE DAILY, Disp: , Rfl:  .  Canagliflozin (INVOKANA PO), Take by mouth. Reported on 10/04/2015, Disp: , Rfl:  .  cephALEXin (KEFLEX) 500 MG capsule, Take 1 capsule (500 mg  total) by mouth 2 (two) times daily. (Patient not taking: Reported on 10/04/2015), Disp: 20 capsule, Rfl: 0 .  phenazopyridine (PYRIDIUM) 200 MG tablet, Take 1 tablet (200 mg total) by mouth 3 (three) times daily. (Patient not taking: Reported on 10/04/2015), Disp: 6 tablet, Rfl: 0 .  rosuvastatin (CRESTOR) 10 MG tablet, Take 10 mg by mouth daily. Reported on 10/04/2015, Disp: , Rfl:   Thank you for the opportunity to care for this patient.  Please do not hesitate to contact me with questions.

## 2015-10-04 NOTE — Assessment & Plan Note (Signed)
   Intermittent, currently well controlled  Continue as needed albuterol (pro-air)

## 2015-10-04 NOTE — Assessment & Plan Note (Addendum)
   Allergic to cat, mold. Handouts given  Start Nasonex 2 sprays each nostril daily  Start nasal saline lavage daily prior to nasal spray  Avoid using oxymetazoline nose sprays due to rebound nasal congestion  Consider allergy injections if you will be getting another cat

## 2015-10-04 NOTE — Patient Instructions (Signed)
Asthma  Intermittent, currently well controlled  Continue as needed albuterol (pro-air)   Other allergic rhinitis  Allergic to cat, mold. Handouts given  Start Nasonex 2 sprays each nostril daily  Start nasal saline lavage daily prior to nasal spray  Avoid using oxymetazoline nose sprays due to rebound nasal congestion  Consider allergy injections if you will be getting another cat

## 2015-10-07 ENCOUNTER — Ambulatory Visit: Payer: Self-pay | Admitting: Pediatrics

## 2015-10-11 ENCOUNTER — Encounter: Payer: Self-pay | Admitting: *Deleted

## 2016-08-02 ENCOUNTER — Other Ambulatory Visit: Payer: Self-pay | Admitting: Allergy

## 2016-08-02 MED ORDER — MOMETASONE FUROATE 50 MCG/ACT NA SUSP: 2.0000 | Freq: Every day | NASAL | 1 refills | Status: AC

## 2016-08-07 ENCOUNTER — Encounter: Payer: Medicaid Other | Attending: Internal Medicine | Admitting: *Deleted

## 2016-08-07 ENCOUNTER — Encounter: Payer: Self-pay | Admitting: *Deleted

## 2016-08-07 DIAGNOSIS — E119 Type 2 diabetes mellitus without complications: Secondary | ICD-10-CM | POA: Diagnosis not present

## 2016-08-07 DIAGNOSIS — F509 Eating disorder, unspecified: Secondary | ICD-10-CM

## 2016-08-07 DIAGNOSIS — E669 Obesity, unspecified: Secondary | ICD-10-CM

## 2016-08-07 DIAGNOSIS — E282 Polycystic ovarian syndrome: Secondary | ICD-10-CM

## 2016-08-07 DIAGNOSIS — Z713 Dietary counseling and surveillance: Secondary | ICD-10-CM | POA: Insufficient documentation

## 2016-08-07 NOTE — Progress Notes (Signed)
  Medical Nutrition Therapy:  Appt start time: 1500 end time:  1630.   Assessment:  Primary concerns today: Karen Barrett was referred for diabetes education.  She states she does not have diabetes, but is concerned about her excessive weight gain. She is known to this provider as her son is a client of mine also.   Has experienced a significant amount of stress.  She is an Surveyor, quantityemotional eater.  She eats in secret She has PCOS and hyperglycemia.  Most recent A1C is 5.9%.  There is a family history of diabetes. Also has dyslipidemia and hypothyroidism.  States she has gained 50 pounds in the past year.  Her TSH went from 4.9 to 9.39 in 3 months.  Before that it was 3.67 3 months before.  She is not seeing an endocrinologist regularly.  She is supposed to see Karen Barrett after the new year after getting more blood work.   She has fibromyalgia and does not work she is also very fatigued  She realizes she does not have a healthy relationship with food.  She has a family history of clean you plate and sneaking food.  She eats while distracted mostly because of pain sitting in another chair.  They don't have family meals mostly due to difficult family dynamics.  She now realizes how much her eating affects her child's eating.  She spent most of the appointment talking about her family stress.  Preferred Learning Style:   No preference indicated   Learning Readiness:   Contemplating    MEDICATIONS: see list   DIETARY INTAKE: 24-hr recall:  B ( AM): large bowl granola with seeds 2% milk Snk ( AM): none  L ( PM): none Snk ( PM): none D ( PM): spring salad mix (very small amount) with chicken legs (2) with ginger poppyseed dressing and tofu (small portion) Snk ( PM): 2-3 cups ice cream (late night) Beverages: water, eggnog twice  Usual physical activity: none    Nutritional Diagnosis:  NB-1.5 Disordered eating pattern As related to meal skipping and emotional eating.  As evidenced by dietary  recall.    Intervention:  Nutrition counseling provided. As much of the visit was spent listening to [facility] describe her sources of stress, there was little time for structured nutrition education.  It sounds like her weight gain is attributable to her severe hypothyroidism and undertreated PCOS as well as her long history of disordered eating.  Advised this will need to be a three-legged approach with nutrition, therapy, and medication management and will not be a quick fix.  Suggested talking with provider about medication management for her PCOS and also following up with endo about her thyroid.     Barriers to learning/adherence to lifestyle change: many  Demonstrated degree of understanding via:  Teach Back   Monitoring/Evaluation:  Dietary intake, exercise, and body weight in a few week(s).

## 2016-08-07 NOTE — Patient Instructions (Signed)
Talk to provider about starting medication for PCOS Keep endocrinology appointment for your thyroid  You deserve to feel better.  There's a medical/nutritional/ and psychological component to this

## 2016-09-07 ENCOUNTER — Ambulatory Visit: Payer: Medicaid Other | Admitting: *Deleted

## 2016-09-21 ENCOUNTER — Encounter: Payer: Medicaid Other | Attending: Internal Medicine | Admitting: *Deleted

## 2016-09-21 DIAGNOSIS — Z713 Dietary counseling and surveillance: Secondary | ICD-10-CM | POA: Diagnosis not present

## 2016-09-21 DIAGNOSIS — E119 Type 2 diabetes mellitus without complications: Secondary | ICD-10-CM | POA: Diagnosis not present

## 2016-09-21 DIAGNOSIS — E669 Obesity, unspecified: Secondary | ICD-10-CM

## 2016-09-21 NOTE — Progress Notes (Signed)
  Medical Nutrition Therapy:  Appt start time: 1615  end time:  1700   Assessment:  Primary concerns today: Mertha was referred for diabetes education.  She states she does not have diabetes, but is concerned about her excessive weight gain. She is known to this provider as her son is a client of mine also.    Started 500 mg Metformin for about 6 weeks.  Fasting glucose is improved.   Feels so tired.  Is overwhelmed with fatigued.  Is waiting on additional test results. Is too tired to cook.  She doesn't sleep well at night so her schedule is off.  Does not eat enough.  Eats 1-2 meals/day and usually at night.     She spent most of the appointment talking about her family stress.  Preferred Learning Style:   No preference indicated   Learning Readiness:   Contemplating   MEDICATIONS: see list   DIETARY INTAKE: 24-hr recall:  B: can't remember D: [employer] chicken pot pie.  Dr pepper Some milk and water S: potatoes, gravy biscuit.  milk  Today: Canned soup  Usual physical activity: none    Nutritional Diagnosis:  NB-1.5 Disordered eating pattern As related to meal skipping and emotional eating.  As evidenced by dietary recall.    Intervention:  Nutrition counseling provided. As much of the visit was [facility] sources of stress, there was little time for structured nutrition education.  The fatigue is affecting her quality of life and self-care.  Discussed inositol supplementation as she is unable to take more metformin.  suggested omega-3.  Stressed need for adequate nutrition.  Need 3 eating occasions/day with carb and protein  Barriers to learning/adherence to lifestyle change: many  Demonstrated degree of understanding via:  Teach Back   Monitoring/Evaluation:  Dietary intake, exercise, and body weight in a few week(s).

## 2016-12-20 ENCOUNTER — Telehealth: Payer: Self-pay | Admitting: *Deleted

## 2016-12-20 NOTE — Telephone Encounter (Signed)
Patient asking about supplements for PCOS   I suggested Ovasitol.  NAC and magnesium sometimes helpful. Also attached handouts for PCOS resources

## 2016-12-21 ENCOUNTER — Telehealth: Payer: Self-pay | Admitting: *Deleted

## 2016-12-21 NOTE — Telephone Encounter (Signed)
Patient asking if her IUD can affect her PCOS?  RESPONSE This is a little outside my scope.  PCOS affects hormone levels so any kind of contraceptive device that also affects hormone levels could play a role.  Typically my clients take birth control pills to help with that

## 2018-08-08 ENCOUNTER — Emergency Department (HOSPITAL_BASED_OUTPATIENT_CLINIC_OR_DEPARTMENT_OTHER)
Admission: EM | Admit: 2018-08-08 | Discharge: 2018-08-08 | Disposition: A | Discharge status: Home / Self Care | Payer: Medicaid Other | Attending: Emergency Medicine | Admitting: Emergency Medicine

## 2018-08-08 ENCOUNTER — Other Ambulatory Visit: Payer: Self-pay

## 2018-08-08 ENCOUNTER — Emergency Department (HOSPITAL_BASED_OUTPATIENT_CLINIC_OR_DEPARTMENT_OTHER): Payer: Medicaid Other

## 2018-08-08 ENCOUNTER — Encounter (HOSPITAL_BASED_OUTPATIENT_CLINIC_OR_DEPARTMENT_OTHER): Payer: Self-pay | Admitting: *Deleted

## 2018-08-08 DIAGNOSIS — R51 Headache: Secondary | ICD-10-CM | POA: Diagnosis not present

## 2018-08-08 DIAGNOSIS — R5383 Other fatigue: Secondary | ICD-10-CM | POA: Insufficient documentation

## 2018-08-08 DIAGNOSIS — L304 Erythema intertrigo: Secondary | ICD-10-CM | POA: Diagnosis not present

## 2018-08-08 DIAGNOSIS — L906 Striae atrophicae: Secondary | ICD-10-CM | POA: Insufficient documentation

## 2018-08-08 DIAGNOSIS — J45909 Unspecified asthma, uncomplicated: Secondary | ICD-10-CM | POA: Insufficient documentation

## 2018-08-08 DIAGNOSIS — Z794 Long term (current) use of insulin: Secondary | ICD-10-CM | POA: Insufficient documentation

## 2018-08-08 DIAGNOSIS — Z79899 Other long term (current) drug therapy: Secondary | ICD-10-CM | POA: Diagnosis not present

## 2018-08-08 DIAGNOSIS — I10 Essential (primary) hypertension: Secondary | ICD-10-CM | POA: Insufficient documentation

## 2018-08-08 DIAGNOSIS — R0600 Dyspnea, unspecified: Secondary | ICD-10-CM | POA: Diagnosis not present

## 2018-08-08 DIAGNOSIS — E119 Type 2 diabetes mellitus without complications: Secondary | ICD-10-CM | POA: Insufficient documentation

## 2018-08-08 DIAGNOSIS — R109 Unspecified abdominal pain: Secondary | ICD-10-CM | POA: Diagnosis not present

## 2018-08-08 DIAGNOSIS — E039 Hypothyroidism, unspecified: Secondary | ICD-10-CM | POA: Insufficient documentation

## 2018-08-08 DIAGNOSIS — R002 Palpitations: Secondary | ICD-10-CM | POA: Diagnosis not present

## 2018-08-08 LAB — COMPREHENSIVE METABOLIC PANEL
ALT: 38 U/L (ref 0–44)
AST: 20 U/L (ref 15–41)
Albumin: 3.9 g/dL (ref 3.5–5.0)
Alkaline Phosphatase: 67 U/L (ref 38–126)
Anion gap: 9 (ref 5–15)
BUN: 19 mg/dL (ref 6–20)
CO2: 24 mmol/L (ref 22–32)
CREATININE: 1.16 mg/dL — AB (ref 0.44–1.00)
Calcium: 9.4 mg/dL (ref 8.9–10.3)
Chloride: 104 mmol/L (ref 98–111)
GFR calc Af Amer: >60 mL/min (ref >60)
GFR calc non Af Amer: 56 mL/min — ABNORMAL LOW (ref >60)
Glucose, Bld: 126 mg/dL — ABNORMAL HIGH (ref 70–99)
Potassium: 4 mmol/L (ref 3.5–5.1)
Sodium: 137 mmol/L (ref 135–145)
Total Bilirubin: 0.6 mg/dL (ref 0.3–1.2)
Total Protein: 7.2 g/dL (ref 6.5–8.1)

## 2018-08-08 LAB — PREGNANCY, URINE: Preg Test, Ur: NEGATIVE

## 2018-08-08 LAB — TROPONIN I: Troponin I: <0.03 ng/mL (ref <0.03)

## 2018-08-08 LAB — CBC WITH DIFFERENTIAL/PLATELET
Abs Immature Granulocytes: 0.03 10*3/uL (ref 0.00–0.07)
Basophils Absolute: 0.1 10*3/uL (ref 0.0–0.1)
Basophils Relative: 1 %
Eosinophils Absolute: 0.2 10*3/uL (ref 0.0–0.5)
Eosinophils Relative: 2 %
HCT: 43.4 % (ref 36.0–46.0)
Hemoglobin: 13.6 g/dL (ref 12.0–15.0)
Immature Granulocytes: 0 %
Lymphocytes Relative: 25 %
Lymphs Abs: 2.6 10*3/uL (ref 0.7–4.0)
MCH: 30.1 pg (ref 26.0–34.0)
MCHC: 31.3 g/dL (ref 30.0–36.0)
MCV: 96 fL (ref 80.0–100.0)
Monocytes Absolute: 1 10*3/uL (ref 0.1–1.0)
Monocytes Relative: 10 %
Neutro Abs: 6.6 10*3/uL (ref 1.7–7.7)
Neutrophils Relative %: 62 %
Platelets: 237 10*3/uL (ref 150–400)
RBC: 4.52 MIL/uL (ref 3.87–5.11)
RDW: 13.2 % (ref 11.5–15.5)
WBC: 10.3 10*3/uL (ref 4.0–10.5)
nRBC: 0 % (ref 0.0–0.2)

## 2018-08-08 LAB — BRAIN NATRIURETIC PEPTIDE: B Natriuretic Peptide: 45.7 pg/mL (ref 0.0–100.0)

## 2018-08-08 LAB — URINALYSIS, ROUTINE W REFLEX MICROSCOPIC
Bilirubin Urine: NEGATIVE
GLUCOSE, UA: NEGATIVE mg/dL
Ketones, ur: NEGATIVE mg/dL
LEUKOCYTES UA: NEGATIVE
Nitrite: NEGATIVE
PH: 6 (ref 5.0–8.0)
Protein, ur: NEGATIVE mg/dL
Specific Gravity, Urine: 1.02 (ref 1.005–1.030)

## 2018-08-08 LAB — URINALYSIS, MICROSCOPIC (REFLEX)

## 2018-08-08 LAB — TSH: TSH: 1.941 u[IU]/mL (ref 0.350–4.500)

## 2018-08-08 IMAGING — CT CT HEAD W/O CM
3 series · 16 of 47 positions shown, 19 images · non-contrast
Comparison: None.

CLINICAL DATA: Patient with altered mental status. Right-sided
headache.

EXAM:
CT HEAD WITHOUT CONTRAST
TECHNIQUE: Contiguous axial images were obtained from the base of the skull
through the vertex without intravenous contrast.

[Series 2: head wo · axial · 0.40mm/px · z∈[-56,+74]mm · 10 of 32 slices shown, 13 images]
[im 3/32  brain]
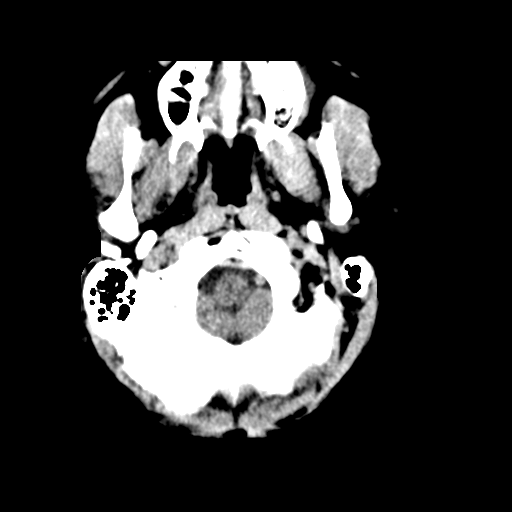
[im 3/32  bone]
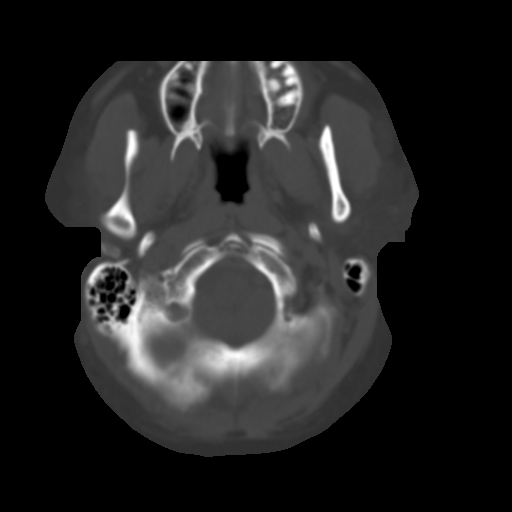
[im 6/32  brain]
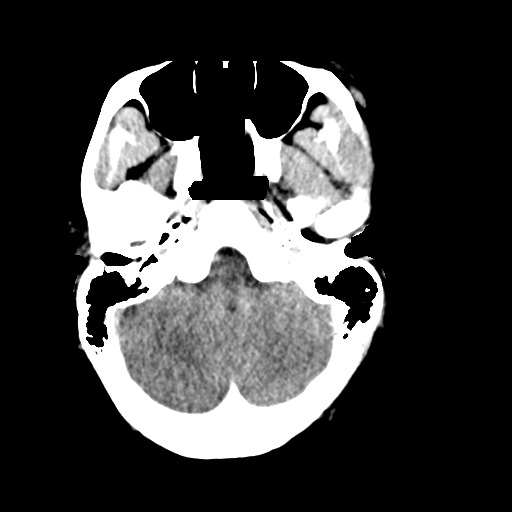
[im 9/32  brain]
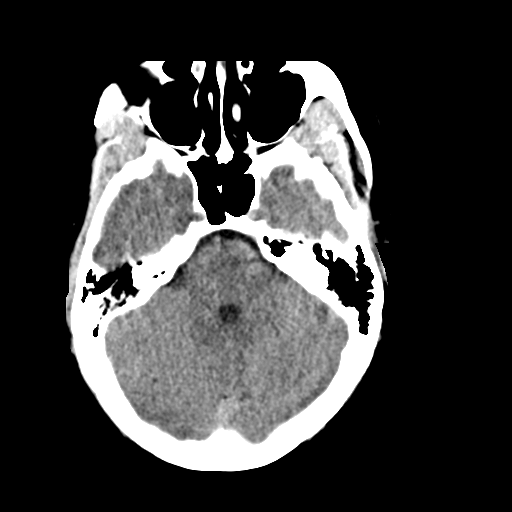
[im 11/32  brain]
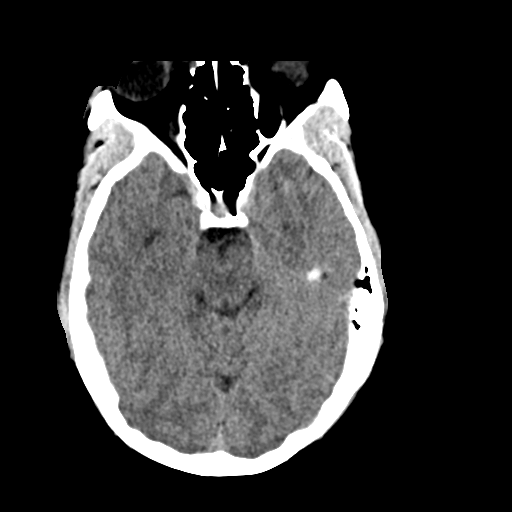
[im 14/32  brain]
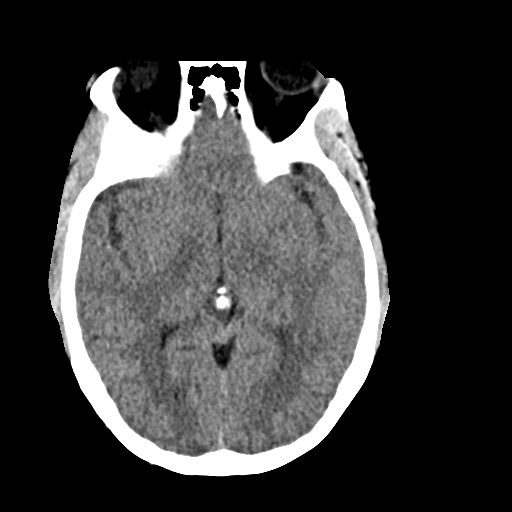
[im 14/32  bone]
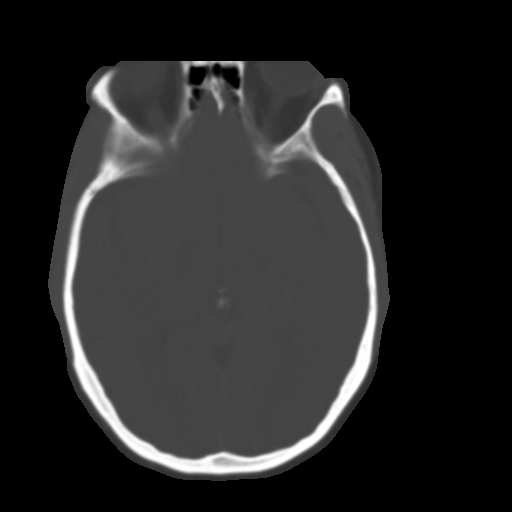
[im 18/32  brain]
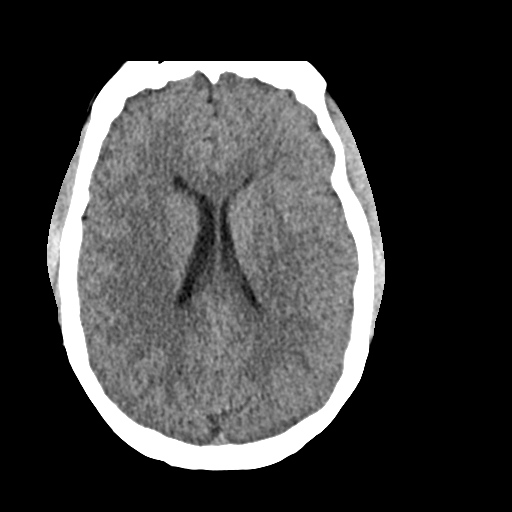
[im 21/32  brain]
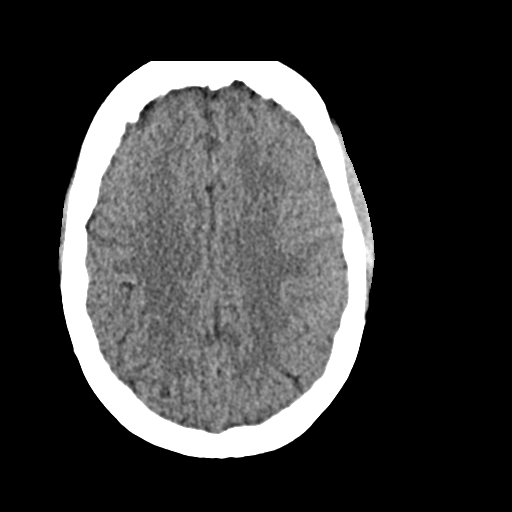
[im 24/32  brain]
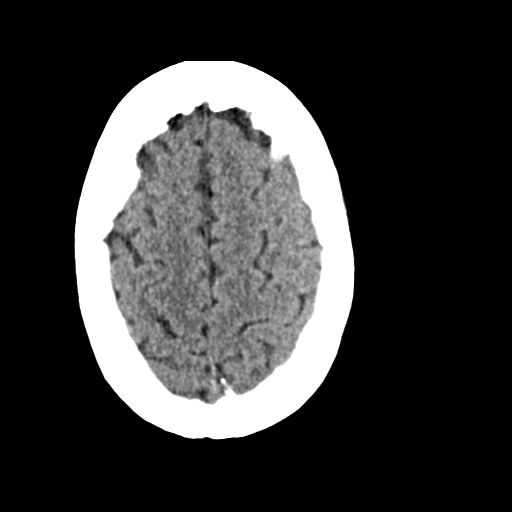
[im 26/32  brain]
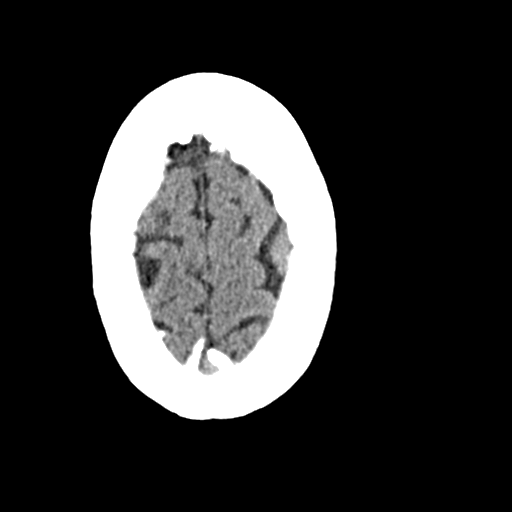
[im 26/32  bone]
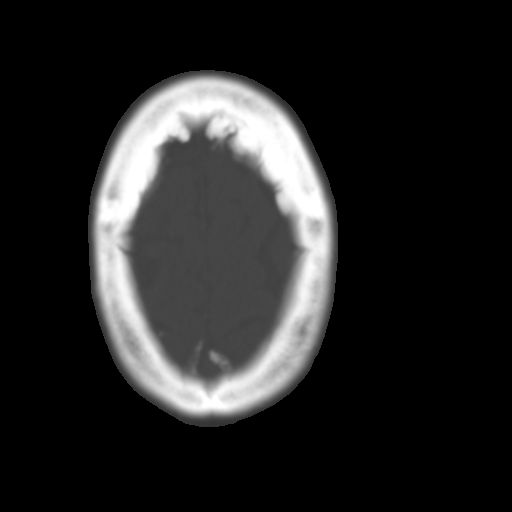
[im 29/32  brain]
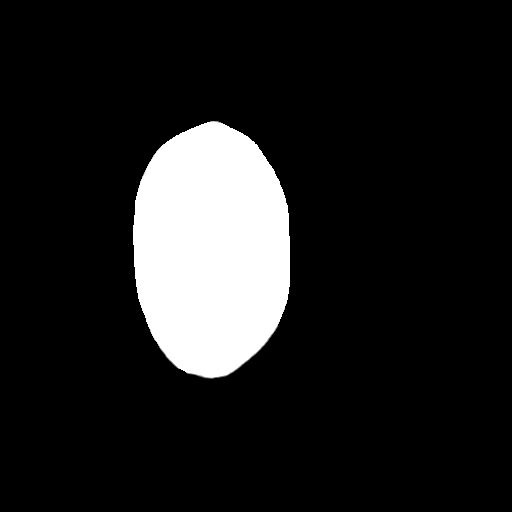

[Series 4: coronal soft · coronal · 0.30mm/px · 3 of 67 slices shown]
[im 23/67  brain]
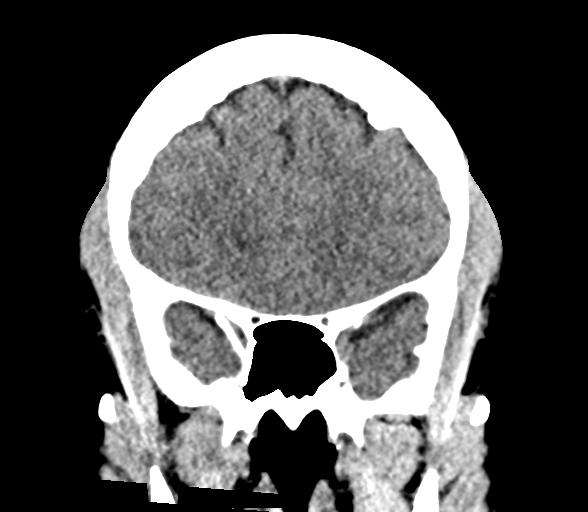
[im 30/67  brain]
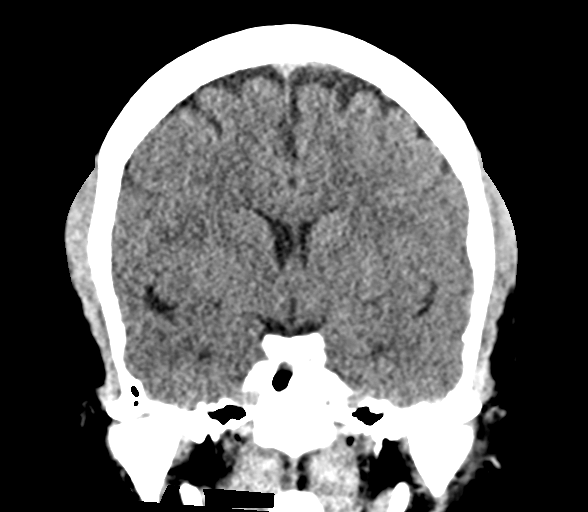
[im 37/67  brain]
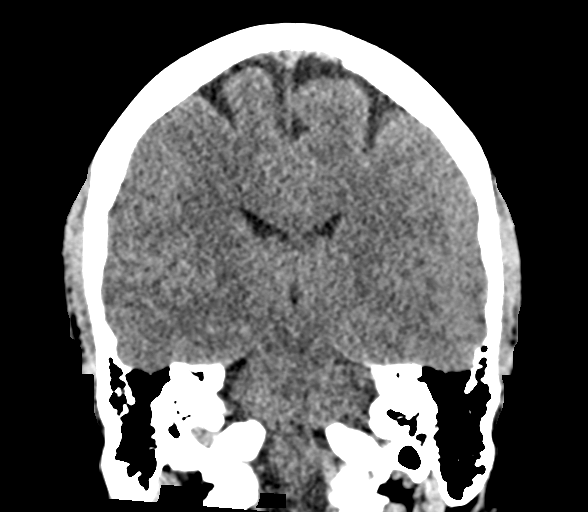

[Series 5: sag soft · sagittal · 0.30mm/px · 3 of 60 slices shown]
[im 20/60  brain]
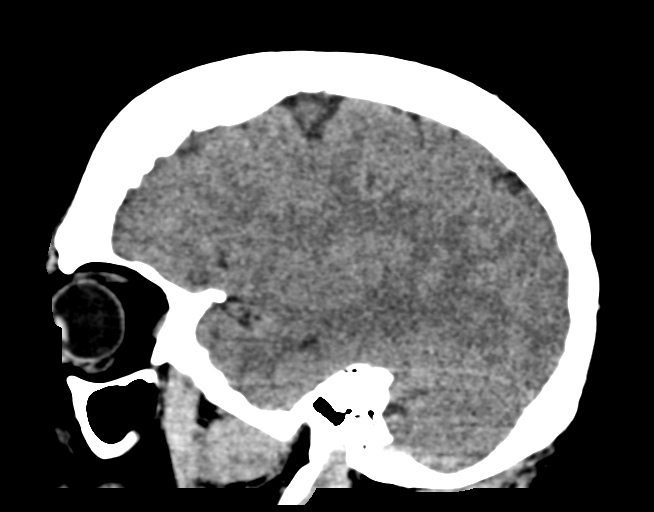
[im 30/60  brain]
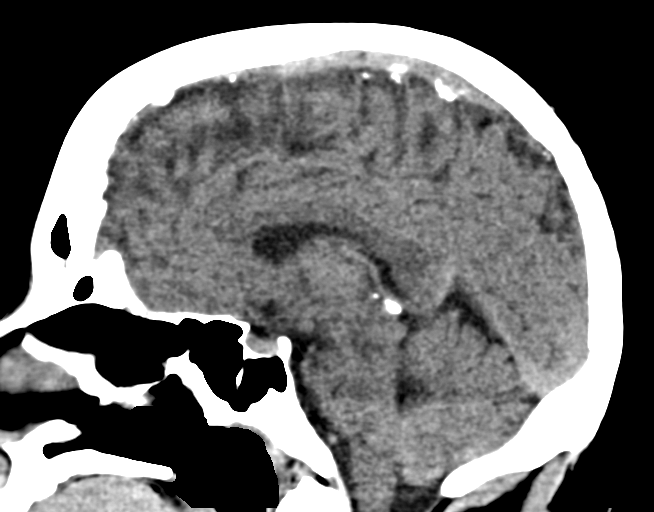
[im 40/60  brain]
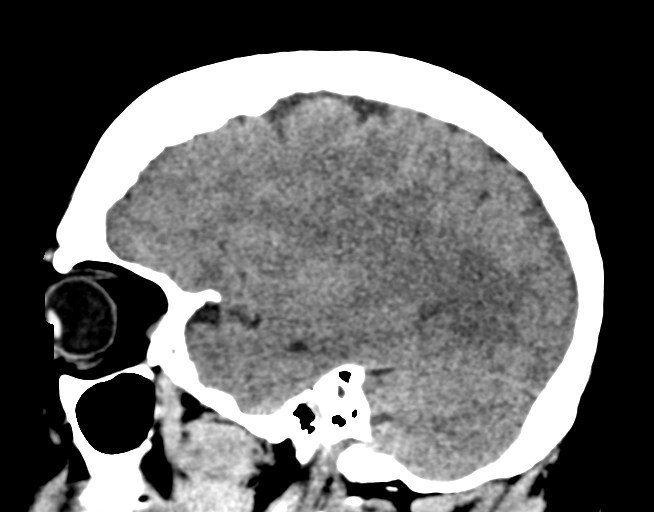

[16 of 47 positions shown; findings below may reference images not displayed]

FINDINGS: Brain: Ventricles and sulci are appropriate for patient's age. No
evidence for acute cortically based infarct, intracranial
hemorrhage, mass lesion or mass-effect.

Vascular: Unremarkable

Skull: Intact.

Sinuses/Orbits: Paranasal sinuses are well aerated. Mastoid air
cells are unremarkable. Orbits are unremarkable.

Other: None.
IMPRESSION: No acute intracranial process.

## 2018-08-08 MED ORDER — CLOTRIMAZOLE 1 % EX CREA: TOPICAL_CREAM | CUTANEOUS | 0 refills | Status: AC

## 2018-08-08 MED ORDER — IOPAMIDOL (ISOVUE-370) INJECTION 76%
100.0000 mL | Freq: Once | INTRAVENOUS | Status: AC | PRN
  Administered 2018-08-08: 100 mL via INTRAVENOUS

## 2018-08-08 NOTE — ED Notes (Signed)
Pt c/o putting on an extra 30 lbs in the past few. Pt states she has been having problems with fatigue for years, but it is getting worse. Pt states she can not stand for 15 minutes at a time due to the fatigue. Pt states she goes 5 weeks without bathing.

## 2018-08-08 NOTE — ED Provider Notes (Signed)
[facility] EMERGENCY DEPARTMENT Provider Note   CSN: [id number] Arrival date & time: 08/08/18  1758     History   Chief Complaint Chief Complaint  Patient presents with  . Palpitations    HPI Karen Barrett is a 47 y.o. female.  Pt presents to the ED today with several symptoms.  She has been sob and has had palpitations.  She also has headaches, abd pain, and skin changes.  The pt has a hx of hypothyroidism and PCOS.  She is followed by endocrinology.  At the last visit in October, there was a plan to r/o cushings.  Pt said she had a steroid injection and then blood work.  She was told this was normal.  The pt said she's been getting progressively sob and has a hard time walking up the stairs.  She has been under a lot of stress.  Her husband left and his parents are living with her.  She's been trying to care for them.  Her son is 36 and has autism and a bad attitude.  He frequently tells her that he hates her.  Pt said she's been extremely fatigued.     Past Medical History:  Diagnosis Date  . Asthma   . Depression   . Diabetes mellitus without complication (HCC)   . Fibromyalgia   . GERD (gastroesophageal reflux disease)   . Hypercholesteremia   . Hypertension   . Hypothyroidism   . Migraine   . PCOS (polycystic ovarian syndrome)   . Transaminasemia     Patient Active Problem List   Diagnosis Date Noted  . Asthma 10/04/2015  . Other allergic rhinitis 10/04/2015    Past Surgical History:  Procedure Laterality Date  . CHOLECYSTECTOMY    . toenail surgery       OB History   No obstetric history on file.      Home Medications    Prior to Admission medications   Medication Sig Start Date End Date Taking? Authorizing Provider  acyclovir (ZOVIRAX) 400 MG tablet as needed.  09/17/15   [provider]  amphetamine-dextroamphetamine (ADDERALL) 30 MG tablet Take 30 mg by mouth as needed.    [provider]  B-D UF III MINI PEN  NEEDLES 31G X 5 MM MISC USE UTD 09/17/15   [provider]  buPROPion (WELLBUTRIN) 100 MG tablet Take 100 mg by mouth 2 (two) times daily. Pt taking 2 tablets twice daily    [provider]  busPIRone (BUSPAR) 5 MG tablet TK 1 T PO BID 09/25/15   [provider]  Butalbital-APAP-Caffeine 50-300-40 MG CAPS TK ONE C PO  Q 6 H PRN for migraine headache 09/17/15   [provider]  BYETTA 5 MCG PEN 5 MCG/0.02ML SOPN injection INJECT 5 MCG UNDER THE SKIN BID 09/22/15   [provider]  Canagliflozin (INVOKANA PO) Take by mouth. Reported on 10/04/2015    [provider]  cephALEXin (KEFLEX) 500 MG capsule Take 1 capsule (500 mg total) by mouth 2 (two) times daily. Patient not taking: Reported on 08/07/2016 11/14/14   Pisciotta, Joni Reining, PA-C  clobetasol cream (TEMOVATE) 0.05 % APPLY PRN 07/27/15   [provider]  clotrimazole (LOTRIMIN) 1 % cream Apply to affected area 2 times daily 08/08/18   Jacalyn Lefevre, MD  cyclobenzaprine (FLEXERIL) 10 MG tablet Take 10 mg by mouth 3 (three) times daily as needed for muscle spasms.    [provider]  dicyclomine (BENTYL) 20 MG tablet TK  1 T PO PRN 09/10/15   [provider]  FIBER SELECT GUMMIES CHEW Chew by mouth. 2 GUMMIES DAILY    [provider]  Insulin Syringe-Needle U-100 (INSULIN SYRINGE 1CC/30GX5/16") 30G X 5/16" 1 ML MISC U UTD 07/05/15   [provider]  Levothyroxine Sodium 112 MCG CAPS TK 1 T PO QD 09/25/15   [provider]  LYSINE PO Take by mouth.    [provider]  magnesium 30 MG tablet Take 30 mg by mouth 2 (two) times daily.    [provider]  meloxicam (MOBIC) 15 MG tablet Take 15 mg by mouth.    [provider]  metFORMIN (GLUCOPHAGE) 500 MG tablet Take by mouth 2 (two) times daily with a meal.    [provider]  metroNIDAZOLE (METROGEL) 0.75 % vaginal gel USE 1 APPLICATORFUL VAGINALLY PRN 08/26/15   [provider]  mometasone (NASONEX) 50 MCG/ACT nasal spray Place 2 sprays into the nose daily. 08/02/16   Bobbitt, Heywood Ilesalph Carter, MD  omeprazole (PRILOSEC) 40 MG capsule TK ONE C PO  D 09/10/15   [provider]  ondansetron (ZOFRAN) 4 MG tablet Take 4 mg by mouth as needed.     [provider]  phenazopyridine (PYRIDIUM) 200 MG tablet Take 1 tablet (200 mg total) by mouth 3 (three) times daily. Patient not taking: Reported on 08/07/2016 11/14/14   Pisciotta, Joni ReiningNicole, PA-C  phentermine 37.5 MG capsule TAKE 1 CAPLET PRN 08/20/15   [provider]  pravastatin (PRAVACHOL) 40 MG tablet TK 1 T ONCE A DAY HS 08/20/15   [provider]  pregabalin (LYRICA) 75 MG capsule Take 75 mg by mouth 2 (two) times daily.    [provider]  PROAIR HFA 108 [phone](90 Base) MCG/ACT inhaler INHALE 2 PUFFS PO QID 08/20/15   [provider]  Probiotic Product ([employer] ADV PROBIOTIC GUMMIES PO) Take by mouth. 2 BY MOUTH DAILY    [provider]  promethazine (PHENERGAN) 25 MG suppository Place 25 mg rectally as needed for nausea or vomiting.    [provider]  rosuvastatin (CRESTOR) 10 MG tablet Take 10 mg by mouth daily. Reported on 10/04/2015    [provider]  sitaGLIPtin (JANUVIA) 100 MG tablet Take 100 mg by mouth daily.    [provider]  spironolactone (ALDACTONE) 100 MG tablet Take 100 mg by mouth 2 (two) times daily.    [provider]  SUMAtriptan (IMITREX) 25 MG tablet Take 25 mg by mouth.    [provider]  topiramate (TOPAMAX) 100 MG tablet Take 50 mg by mouth 2 (two) times daily.     [provider]  traMADol (ULTRAM) 50 MG tablet Take by mouth every 6 (six) hours as needed.    [provider]  TURMERIC CURCUMIN PO Take by mouth.    [provider]  Vilazodone HCl (VIIBRYD) 20 MG TABS Take by mouth.    [provider]  Vitamin D, Cholecalciferol, 1000 units CAPS Take 1,000  mg by mouth. TAKE S 2 CAPS TWICE DAILY    [provider]  Zinc Sulfate (ZINC 15 PO) Take by mouth.    [provider]    Family History Family History  Problem Relation Age of Onset  . Allergic rhinitis Mother   . Asthma Mother   . Cancer Father   . Food Allergy Sister   . Eczema Son   . Anxiety disorder Son   . Autism Son   .  Emphysema Maternal Grandmother   . Congestive Heart Failure Maternal Grandmother   . Congestive Heart Failure Maternal Grandfather   . COPD Other   . Hyperlipidemia Other   . Hypertension Other   . Stroke Other   . Dementia Other   . Diabetes Other     Social History Social History   Tobacco Use  . Smoking status: Never Smoker  . Smokeless tobacco: Never Used  Substance Use Topics  . Alcohol use: No  . Drug use: No     Allergies   Codeine; Erythromycin; Paxil [paroxetine hcl]; Sertraline; Zoloft [sertraline hcl]; and Sulfa antibiotics   Review of Systems Review of Systems  Constitutional: Positive for unexpected weight change.  Respiratory: Positive for shortness of breath.   Cardiovascular: Positive for palpitations.  Gastrointestinal: Positive for abdominal pain.  Skin: Positive for color change and rash.  All other systems reviewed and are negative.    Physical Exam Updated Vital Signs BP (!) 139/58 (BP Location: Left Arm)   Pulse 86   Temp 98.4 F (36.9 C)   Resp (!) 22   Ht 5\' 6"  (1.676 m)   Wt (!) 149.7 kg   SpO2 96%   BMI 53.26 kg/m   Physical Exam Vitals signs and nursing note reviewed.  Constitutional:      Appearance: She is obese.  HENT:     Head: Normocephalic and atraumatic.     Comments: Hirsutism Facial plethora    Right Ear: External ear normal.     Left Ear: External ear normal.     Nose: Nose normal.     Mouth/Throat:     Mouth: Mucous membranes are moist.     Pharynx: Oropharynx is clear.  Eyes:     Extraocular Movements: Extraocular movements intact.     Conjunctiva/sclera:  Conjunctivae normal.     Pupils: Pupils are equal, round, and reactive to light.  Neck:     Musculoskeletal: Normal range of motion and neck supple.  Cardiovascular:     Rate and Rhythm: Regular rhythm. Tachycardia present.     Pulses: Normal pulses.     Heart sounds: Normal heart sounds.  Pulmonary:     Effort: Pulmonary effort is normal.     Breath sounds: Normal breath sounds.  Abdominal:     General: Abdomen is flat.  Musculoskeletal: Normal range of motion.  Skin:    General: Skin is warm.     Capillary Refill: Capillary refill takes less than 2 seconds.     Comments: Intertrigo under both breasts and pannus Striae on abdomen  Neurological:     General: No focal deficit present.     Mental Status: She is alert.  Psychiatric:        Attention and Perception: Attention and perception normal.        Mood and Affect: Mood is anxious.        Speech: Speech normal.        Behavior: Behavior normal.        Thought Content: Thought content normal.        Cognition and Memory: Cognition normal.      ED Treatments / Results  Labs (all labs ordered are listed, but only abnormal results are displayed) Labs Reviewed  URINALYSIS, ROUTINE W REFLEX MICROSCOPIC - Abnormal; Notable for the following components:      Result Value   Hgb urine dipstick MODERATE (*)    All other components within normal limits  COMPREHENSIVE METABOLIC PANEL - Abnormal;  Notable for the following components:   Glucose, Bld 126 (*)    Creatinine, Ser 1.16 (*)    GFR calc non Af Amer 56 (*)    All other components within normal limits  URINALYSIS, MICROSCOPIC (REFLEX) - Abnormal; Notable for the following components:   Bacteria, UA MANY (*)    All other components within normal limits  CBC WITH DIFFERENTIAL/PLATELET  BRAIN NATRIURETIC PEPTIDE  TROPONIN I  PREGNANCY, URINE  TSH    EKG EKG Interpretation  Date/Time:  Thursday August 08 2018 18:17:54 EST Ventricular Rate:  87 PR Interval:      QRS Duration: 90 QT Interval:  341 QTC Calculation: 411 R Axis:   80 Text Interpretation:  Sinus rhythm No old tracing to compare Confirmed by Jacalyn Lefevre [phone]) on 08/08/2018 6:40:49 PM   Radiology Ct Head Wo Contrast  Result Date: 08/08/2018 CLINICAL DATA:  Patient with altered mental status. Right-sided headache. EXAM: CT HEAD WITHOUT CONTRAST TECHNIQUE: Contiguous axial images were obtained from the base of the skull through the vertex without intravenous contrast. COMPARISON:  None. FINDINGS: Brain: Ventricles and sulci are appropriate for patient's age. No evidence for acute cortically based infarct, intracranial hemorrhage, mass lesion or mass-effect. Vascular: Unremarkable Skull: Intact. Sinuses/Orbits: Paranasal sinuses are well aerated. Mastoid air cells are unremarkable. Orbits are unremarkable. Other: None. IMPRESSION: No acute intracranial process. Electronically Signed   By: Annia Belt M.D.   On: 08/08/2018 21:03   Ct Angio Chest Pe W Or Wo Contrast  Result Date: 08/08/2018 CLINICAL DATA:  Palpitation short of breath EXAM: CT ANGIOGRAPHY CHEST CT ABDOMEN AND PELVIS WITH CONTRAST TECHNIQUE: Multidetector CT imaging of the chest was performed using the standard protocol during bolus administration of intravenous contrast. Multiplanar CT image reconstructions and MIPs were obtained to evaluate the vascular anatomy. Multidetector CT imaging of the abdomen and pelvis was performed using the standard protocol during bolus administration of intravenous contrast. CONTRAST:  ISOVUE-370 IOPAMIDOL (ISOVUE-370) INJECTION 76% COMPARISON:  None. FINDINGS: CTA CHEST FINDINGS Cardiovascular: Satisfactory opacification of the pulmonary arteries to the segmental level. No evidence of pulmonary embolism. Normal heart size. No pericardial effusion. Nonaneurysmal aorta. No dissection. Mediastinum/Nodes: No enlarged mediastinal, hilar, or axillary lymph nodes. Thyroid gland, trachea, and  esophagus demonstrate no significant findings. Lungs/Pleura: Lungs are clear. No pleural effusion or pneumothorax. Musculoskeletal: Degenerative changes of the spine. No acute or suspicious abnormality. Review of the MIP images confirms the above findings. CT ABDOMEN and PELVIS FINDINGS Hepatobiliary: No focal liver abnormality is seen. Status post cholecystectomy. No biliary dilatation. Pancreas: Unremarkable. No pancreatic ductal dilatation or surrounding inflammatory changes. Spleen: Normal in size without focal abnormality. Adrenals/Urinary Tract: Adrenal glands are unremarkable. Kidneys are normal, without renal calculi, focal lesion, or hydronephrosis. Bladder is unremarkable. Stomach/Bowel: Stomach is within normal limits. Appendix not well seen but no right lower quadrant inflammatory process. No evidence of bowel wall thickening, distention, or inflammatory changes. Vascular/Lymphatic: Mild aortic atherosclerosis. No aneurysm. No significantly enlarged lymph nodes. Reproductive: No adnexal mass. Intrauterine device with slight oblique orientation of the T prongs with respect to the uterine fundus. Other: No abdominal wall hernia or abnormality. No abdominopelvic ascites. Musculoskeletal: No acute or significant osseous findings. Review of the MIP images confirms the above findings. IMPRESSION: 1. Negative for acute pulmonary embolus or aortic dissection. Clear lung fields 2. No CT evidence for acute intra-abdominal or pelvic abnormality 3. Slight oblique orientation of the intrauterine device T prongs with respect to the uterine fundus. Electronically Signed  By: Jasmine PangKim  Fujinaga M.D.   On: 08/08/2018 21:10   Ct Abdomen Pelvis W Contrast  Result Date: 08/08/2018 CLINICAL DATA:  Palpitation short of breath EXAM: CT ANGIOGRAPHY CHEST CT ABDOMEN AND PELVIS WITH CONTRAST TECHNIQUE: Multidetector CT imaging of the chest was performed using the standard protocol during bolus administration of intravenous  contrast. Multiplanar CT image reconstructions and MIPs were obtained to evaluate the vascular anatomy. Multidetector CT imaging of the abdomen and pelvis was performed using the standard protocol during bolus administration of intravenous contrast. CONTRAST:  100mL ISOVUE-370 IOPAMIDOL (ISOVUE-370) INJECTION 76% COMPARISON:  None. FINDINGS: CTA CHEST FINDINGS Cardiovascular: Satisfactory opacification of the pulmonary arteries to the segmental level. No evidence of pulmonary embolism. Normal heart size. No pericardial effusion. Nonaneurysmal aorta. No dissection. Mediastinum/Nodes: No enlarged mediastinal, hilar, or axillary lymph nodes. Thyroid gland, trachea, and esophagus demonstrate no significant findings. Lungs/Pleura: Lungs are clear. No pleural effusion or pneumothorax. Musculoskeletal: Degenerative changes of the spine. No acute or suspicious abnormality. Review of the MIP images confirms the above findings. CT ABDOMEN and PELVIS FINDINGS Hepatobiliary: No focal liver abnormality is seen. Status post cholecystectomy. No biliary dilatation. Pancreas: Unremarkable. No pancreatic ductal dilatation or surrounding inflammatory changes. Spleen: Normal in size without focal abnormality. Adrenals/Urinary Tract: Adrenal glands are unremarkable. Kidneys are normal, without renal calculi, focal lesion, or hydronephrosis. Bladder is unremarkable. Stomach/Bowel: Stomach is within normal limits. Appendix not well seen but no right lower quadrant inflammatory process. No evidence of bowel wall thickening, distention, or inflammatory changes. Vascular/Lymphatic: Mild aortic atherosclerosis. No aneurysm. No significantly enlarged lymph nodes. Reproductive: No adnexal mass. Intrauterine device with slight oblique orientation of the T prongs with respect to the uterine fundus. Other: No abdominal wall hernia or abnormality. No abdominopelvic ascites. Musculoskeletal: No acute or significant osseous findings. Review of the  MIP images confirms the above findings. IMPRESSION: 1. Negative for acute pulmonary embolus or aortic dissection. Clear lung fields 2. No CT evidence for acute intra-abdominal or pelvic abnormality 3. Slight oblique orientation of the intrauterine device T prongs with respect to the uterine fundus. Electronically Signed   By: Jasmine PangKim  Fujinaga M.D.   On: 08/08/2018 21:10    Procedures Procedures (including critical care time)  Medications Ordered in ED Medications  iopamidol (ISOVUE-370) 76 % injection 100 mL (100 mLs Intravenous Contrast Given 08/08/18 2009)     Initial Impression / Assessment and Plan / ED Course  I have reviewed the triage vital signs and the nursing notes.  Pertinent labs & imaging results that were available during my care of the patient were reviewed by me and considered in my medical decision making (see chart for details).    Extensive work up here reveals nothing acute.  Pt instructed to f/u with pcp, endocrinology, and with cardiology.  Pt knows to return if worse.  Final Clinical Impressions(s) / ED Diagnoses   Final diagnoses:  Palpitations  Fatigue, unspecified type  Dyspnea, unspecified type  Abdominal pain, unspecified abdominal location  Striae  Intertrigo    ED Discharge Orders         Ordered    clotrimazole (LOTRIMIN) 1 % cream     08/08/18 2156           Jacalyn LefevreHaviland, Navika Hoopes, MD 08/08/18 2157

## 2018-08-08 NOTE — ED Notes (Signed)
Pt in bathroom

## 2018-08-08 NOTE — ED Triage Notes (Signed)
Pt c/o "palpatation " x 2 months , SOB x 2 weeks

## 2019-06-16 IMAGING — CT CT ABD-PELV W/ CM
3 of 12 series · 11 of 46 positions shown, 17 images · IV contrast (iopamidol)
Comparison: None.

CLINICAL DATA: Palpitation short of breath

EXAM:
CT ANGIOGRAPHY CHEST
CT ABDOMEN AND PELVIS WITH CONTRAST
TECHNIQUE: Multidetector CT imaging of the chest was performed using the
standard protocol during bolus administration of intravenous
contrast. Multiplanar CT image reconstructions and MIPs were
obtained to evaluate the vascular anatomy. Multidetector CT imaging
of the abdomen and pelvis was performed using the standard protocol
during bolus administration of intravenous contrast.
CONTRAST:  100mL [id number] IOPAMIDOL ([id number]-YLG) INJECTION 76%

[Series 6: pe thins · axial · 0.86mm/px · z∈[-438,-258]mm · 6 of 301 slices shown]
[im 21/301  soft-tissue]
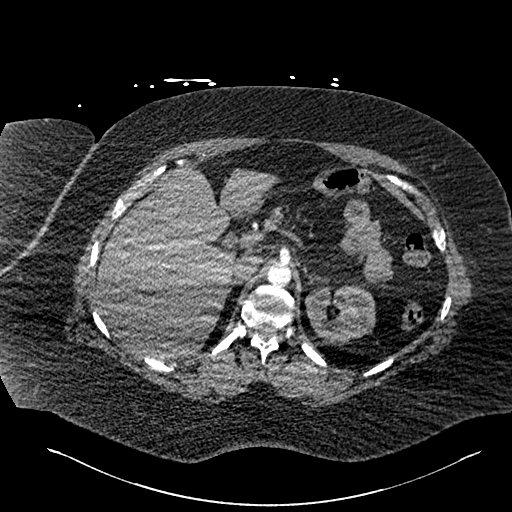
[im 61/301  soft-tissue]
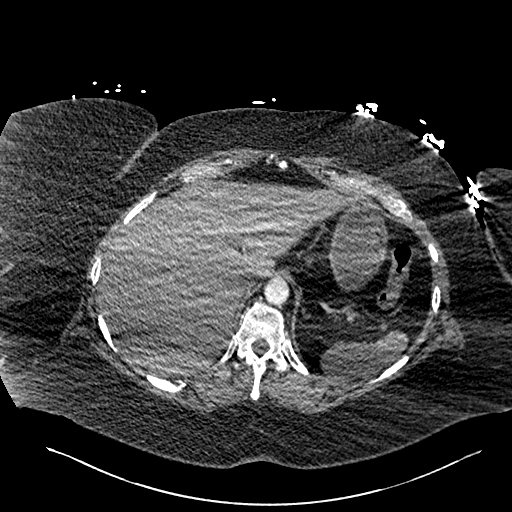
[im 101/301  soft-tissue]
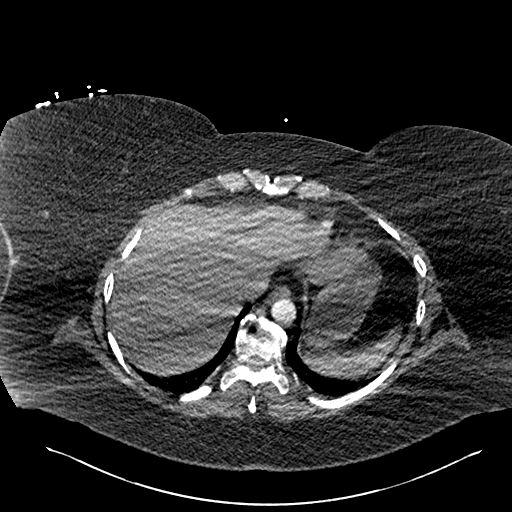
[im 141/301  soft-tissue]
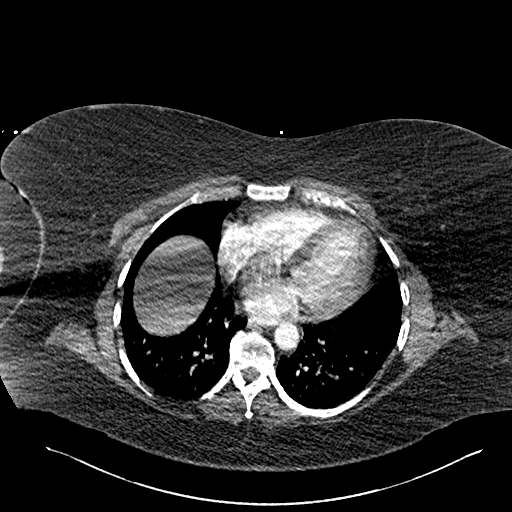
[im 161/301  soft-tissue]
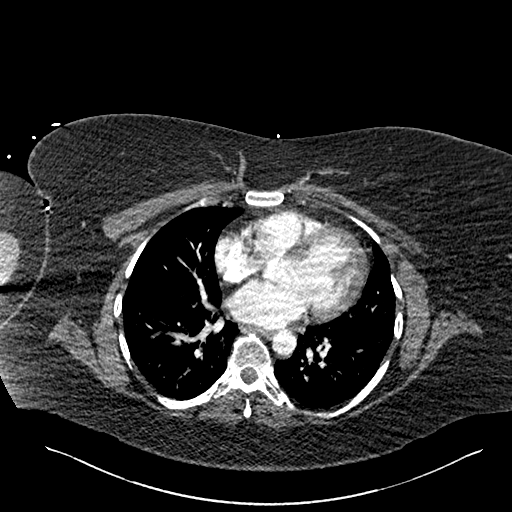
[im 201/301  soft-tissue]
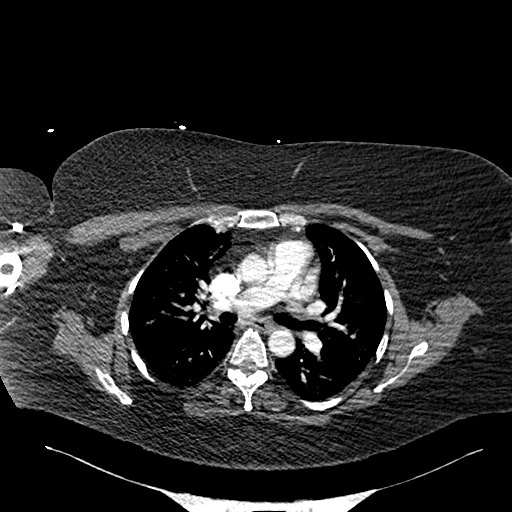

[Series 7: pe coronal mpr · coronal · 0.64mm/px · 1 of 151 slices shown, 2 images]
[im 76/151  soft-tissue]
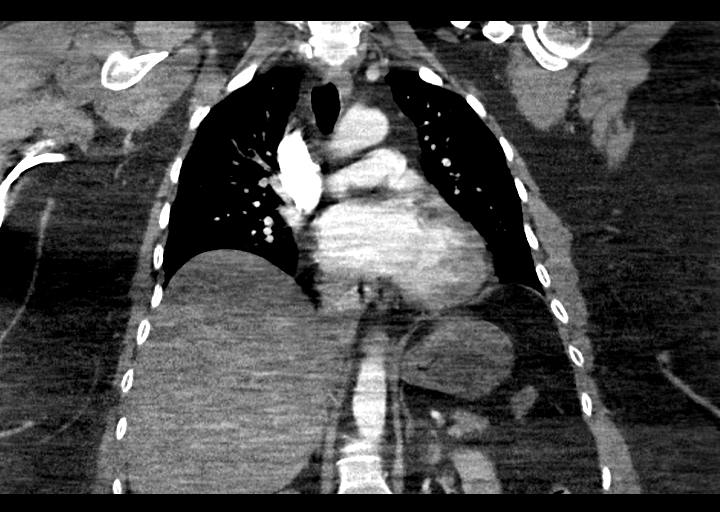
[im 76/151  bone]
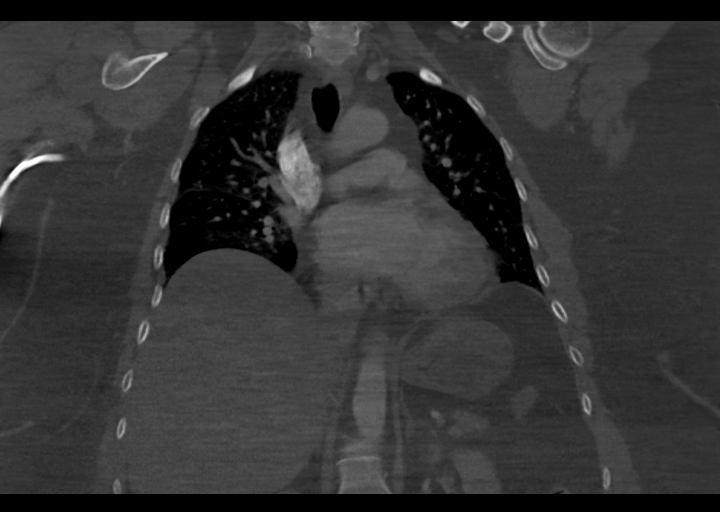

[Series 11: axial st · axial · 0.98mm/px · z∈[-725,-435]mm · 4 of 98 slices shown, 9 images]
[im 20/98  soft-tissue]
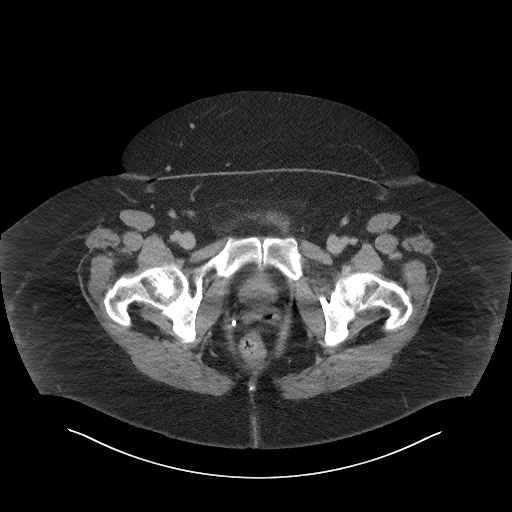
[im 20/98  lung]
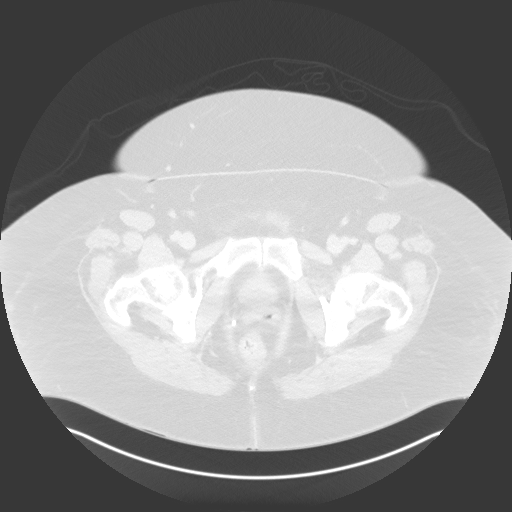
[im 20/98  bone]
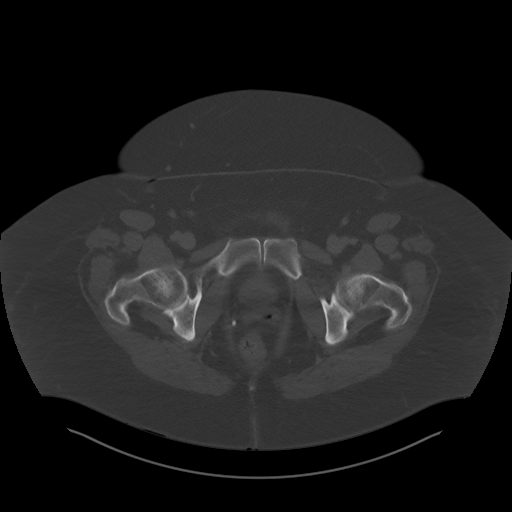
[im 39/98  soft-tissue]
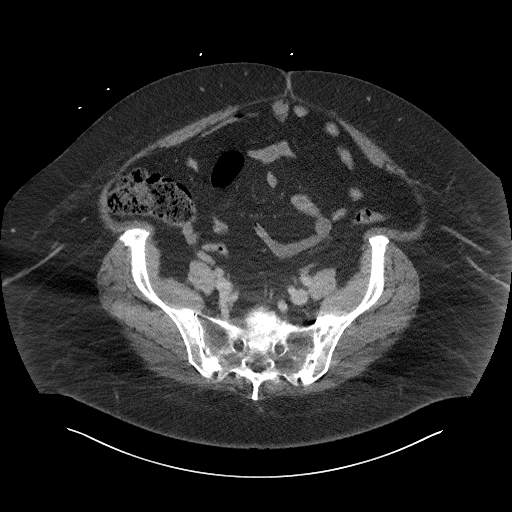
[im 39/98  lung]
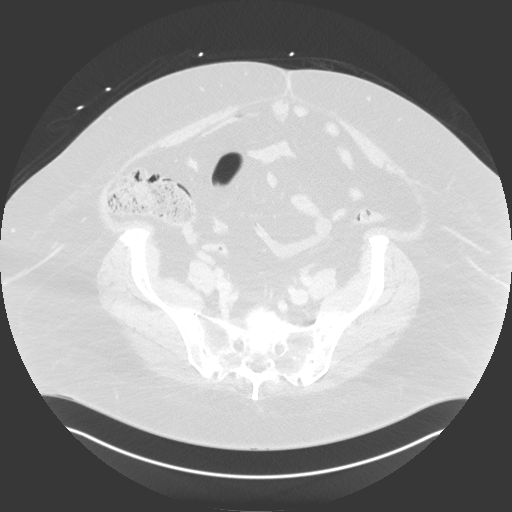
[im 59/98  soft-tissue]
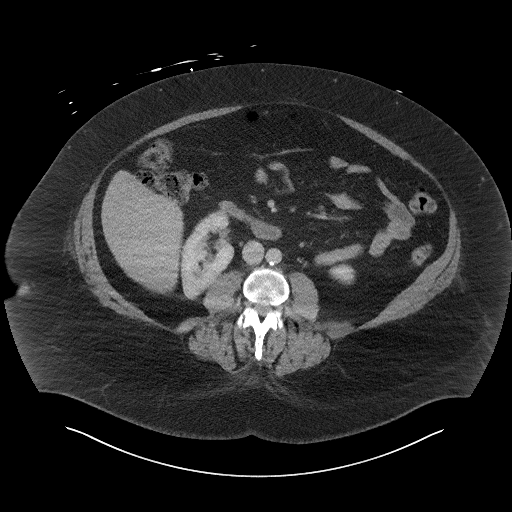
[im 59/98  lung]
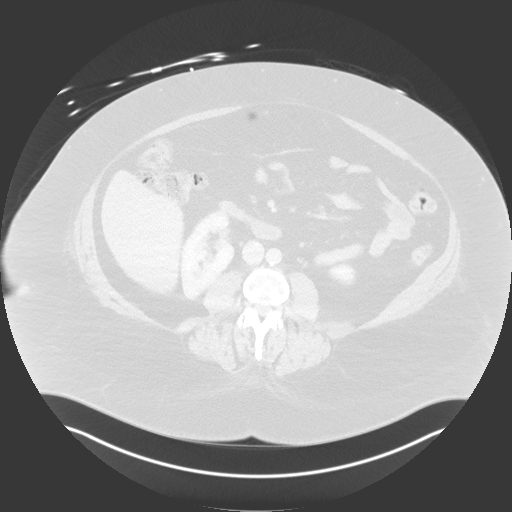
[im 78/98  soft-tissue]
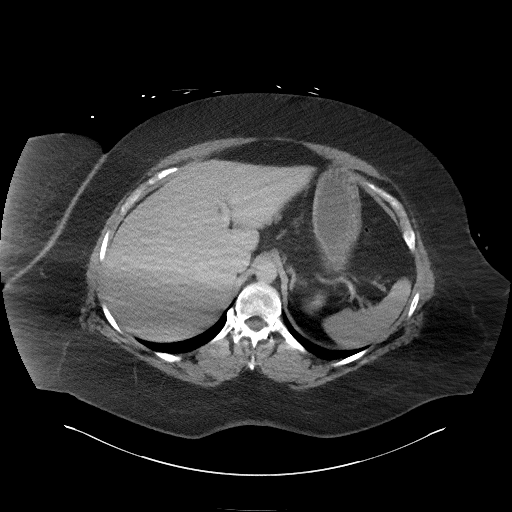
[im 78/98  lung]
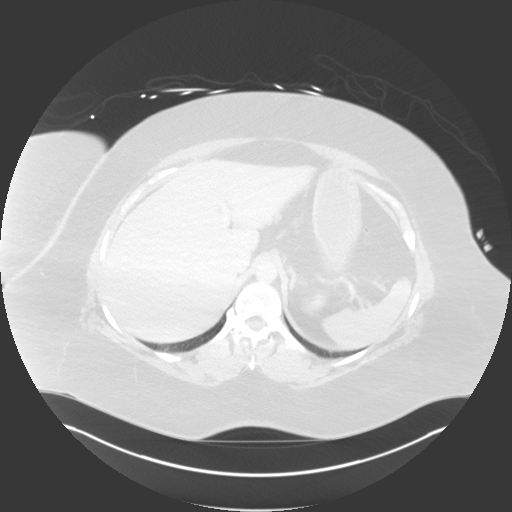

[11 of 46 positions shown; findings below may reference images not displayed]

FINDINGS: CTA CHEST FINDINGS

Cardiovascular: Satisfactory opacification of the pulmonary arteries
to the segmental level. No evidence of pulmonary embolism. Normal
heart size. No pericardial effusion. Nonaneurysmal aorta. No
dissection.

Mediastinum/Nodes: No enlarged mediastinal, hilar, or axillary lymph
nodes. Thyroid gland, trachea, and esophagus demonstrate no
significant findings.

Lungs/Pleura: Lungs are clear. No pleural effusion or pneumothorax.

Musculoskeletal: Degenerative changes of the spine. No acute or
suspicious abnormality.

Review of the MIP images confirms the above findings.

CT ABDOMEN and PELVIS FINDINGS

Hepatobiliary: No focal liver abnormality is seen. Status post
cholecystectomy. No biliary dilatation.

Pancreas: Unremarkable. No pancreatic ductal dilatation or
surrounding inflammatory changes.

Spleen: Normal in size without focal abnormality.

Adrenals/Urinary Tract: Adrenal glands are unremarkable. Kidneys are
normal, without renal calculi, focal lesion, or hydronephrosis.
Bladder is unremarkable.

Stomach/Bowel: Stomach is within normal limits. Appendix not well
seen but no right lower quadrant inflammatory process. No evidence
of bowel wall thickening, distention, or inflammatory changes.

Vascular/Lymphatic: Mild aortic atherosclerosis. No aneurysm. No
significantly enlarged lymph nodes.

Reproductive: No adnexal mass. Intrauterine device with slight
oblique orientation of the T prongs with respect to the uterine
fundus.

Other: No abdominal wall hernia or abnormality. No abdominopelvic
ascites.

Musculoskeletal: No acute or significant osseous findings.

Review of the MIP images confirms the above findings.
IMPRESSION: 1. Negative for acute pulmonary embolus or aortic dissection. Clear
lung fields
2. No CT evidence for acute intra-abdominal or pelvic abnormality
3. Slight oblique orientation of the intrauterine device T prongs
with respect to the uterine fundus.

## 2021-08-17 ENCOUNTER — Other Ambulatory Visit: Payer: Self-pay | Admitting: Physician Assistant

## 2021-08-17 DIAGNOSIS — R7989 Other specified abnormal findings of blood chemistry: Secondary | ICD-10-CM

## 2022-12-26 ENCOUNTER — Other Ambulatory Visit: Payer: Self-pay | Admitting: Physician Assistant

## 2022-12-26 DIAGNOSIS — R7989 Other specified abnormal findings of blood chemistry: Secondary | ICD-10-CM
